# Patient Record
Sex: Female | Born: 1985 | Race: White | Hispanic: No | Marital: Married | Smoking: Former smoker
Health system: Southern US, Community
[De-identification: ages and names within clinical notes are randomized; demographics above are authoritative.]

## PROBLEM LIST (undated history)

## (undated) ENCOUNTER — Inpatient Hospital Stay: Payer: Self-pay

## (undated) DIAGNOSIS — F419 Anxiety disorder, unspecified: Secondary | ICD-10-CM

## (undated) HISTORY — DX: Anxiety disorder, unspecified: F41.9

## (undated) HISTORY — PX: BREAST ENHANCEMENT SURGERY: SHX7

---

## 2005-08-08 ENCOUNTER — Ambulatory Visit: Payer: Self-pay | Admitting: Endocrinology

## 2005-08-12 ENCOUNTER — Encounter: Payer: Self-pay | Admitting: Endocrinology

## 2008-06-03 DIAGNOSIS — R87610 Atypical squamous cells of undetermined significance on cytologic smear of cervix (ASC-US): Secondary | ICD-10-CM

## 2008-06-03 HISTORY — DX: Atypical squamous cells of undetermined significance on cytologic smear of cervix (ASC-US): R87.610

## 2009-06-30 ENCOUNTER — Emergency Department (HOSPITAL_COMMUNITY): Admission: EM | Admit: 2009-06-30 | Discharge: 2009-06-30 | Payer: Self-pay | Admitting: Emergency Medicine

## 2013-06-15 ENCOUNTER — Observation Stay: Payer: Self-pay | Admitting: Obstetrics & Gynecology

## 2013-06-28 ENCOUNTER — Inpatient Hospital Stay: Payer: Self-pay

## 2013-06-28 LAB — CBC WITH DIFFERENTIAL/PLATELET
BASOS ABS: 0.1 10*3/uL (ref 0.0–0.1)
Basophil %: 0.9 %
Eosinophil #: 0.2 10*3/uL (ref 0.0–0.7)
Eosinophil %: 2.4 %
HCT: 35 % (ref 35.0–47.0)
HGB: 11.9 g/dL — AB (ref 12.0–16.0)
LYMPHS ABS: 2.2 10*3/uL (ref 1.0–3.6)
Lymphocyte %: 22.8 %
MCH: 29.9 pg (ref 26.0–34.0)
MCHC: 33.9 g/dL (ref 32.0–36.0)
MCV: 88 fL (ref 80–100)
Monocyte #: 0.7 x10 3/mm (ref 0.2–0.9)
Monocyte %: 7 %
NEUTROS PCT: 66.9 %
Neutrophil #: 6.4 10*3/uL (ref 1.4–6.5)
Platelet: 273 10*3/uL (ref 150–440)
RBC: 3.97 10*6/uL (ref 3.80–5.20)
RDW: 14.3 % (ref 11.5–14.5)
WBC: 9.5 10*3/uL (ref 3.6–11.0)

## 2013-06-28 LAB — GC/CHLAMYDIA PROBE AMP

## 2013-06-29 LAB — HEMATOCRIT: HCT: 26.4 % — ABNORMAL LOW (ref 35.0–47.0)

## 2013-06-30 LAB — CBC WITH DIFFERENTIAL/PLATELET
Basophil #: 0.1 10*3/uL (ref 0.0–0.1)
Basophil %: 0.5 %
Eosinophil #: 0 10*3/uL (ref 0.0–0.7)
Eosinophil %: 0.4 %
HCT: 27.4 % — ABNORMAL LOW (ref 35.0–47.0)
HGB: 9.3 g/dL — AB (ref 12.0–16.0)
LYMPHS PCT: 10.6 %
Lymphocyte #: 1.2 10*3/uL (ref 1.0–3.6)
MCH: 30.1 pg (ref 26.0–34.0)
MCHC: 33.9 g/dL (ref 32.0–36.0)
MCV: 89 fL (ref 80–100)
Monocyte #: 0.7 x10 3/mm (ref 0.2–0.9)
Monocyte %: 6.5 %
NEUTROS ABS: 9 10*3/uL — AB (ref 1.4–6.5)
Neutrophil %: 82 %
Platelet: 240 10*3/uL (ref 150–440)
RBC: 3.09 10*6/uL — AB (ref 3.80–5.20)
RDW: 13.9 % (ref 11.5–14.5)
WBC: 11 10*3/uL (ref 3.6–11.0)

## 2013-06-30 LAB — URINALYSIS, COMPLETE
Bacteria: NONE SEEN
Bilirubin,UR: NEGATIVE
Glucose,UR: NEGATIVE mg/dL (ref 0–75)
Ketone: NEGATIVE
LEUKOCYTE ESTERASE: NEGATIVE
Nitrite: NEGATIVE
Ph: 8 (ref 4.5–8.0)
Protein: NEGATIVE
RBC,UR: 36 /HPF (ref 0–5)
Specific Gravity: 1.017 (ref 1.003–1.030)
WBC UR: 4 /HPF (ref 0–5)

## 2013-06-30 LAB — BASIC METABOLIC PANEL
Anion Gap: 8 (ref 7–16)
BUN: 14 mg/dL (ref 7–18)
CHLORIDE: 103 mmol/L (ref 98–107)
CO2: 25 mmol/L (ref 21–32)
CREATININE: 0.86 mg/dL (ref 0.60–1.30)
Calcium, Total: 9.3 mg/dL (ref 8.5–10.1)
EGFR (Non-African Amer.): >60
Glucose: 99 mg/dL (ref 65–99)
Osmolality: 272 (ref 275–301)
Potassium: 3.5 mmol/L (ref 3.5–5.1)
Sodium: 136 mmol/L (ref 136–145)

## 2013-06-30 LAB — RAPID INFLUENZA A&B ANTIGENS

## 2013-07-01 ENCOUNTER — Observation Stay: Payer: Self-pay | Admitting: Obstetrics and Gynecology

## 2013-07-02 LAB — URINE CULTURE

## 2013-07-05 LAB — CULTURE, BLOOD (SINGLE)

## 2014-10-11 NOTE — H&P (Signed)
L&D Evaluation:  History:  HPI 29 yo G1 with EDC=06/29/2013 presents at 39.6 weeks with c/o ctxs since 4 this AM which she said were every 10 minutes. She has a bloody show on exam, but had not noticed any bleeding or LOF prior to her exam.  P[facility] at [facility] remarkable for anxiety and she currently takes 25 mgm daily of zoloft. LABS: A POS, RI, VI, GBS negative. TDAP given 05/07/13   Presents with contractions   Patient's Medical History Anxiety   Patient's Surgical History Breast augmentation, Wisdom Teeth Extraction   Medications Pre Natal Vitamins  Zoloft 25 mgm daily   Allergies NKDA   Social History none  Former smoker-quit with pregnaancy.   Family History Non-Contributory   ROS:  ROS see HPI   Exam:  Vital Signs stable  133/80   Urine Protein not completed   General no apparent distress   Mental Status clear   Chest clear   Heart normal sinus rhythm, no murmur/gallop/rubs   Abdomen gravid, tender with contractions   Estimated Fetal Weight Average for gestational age   Fetal Position cephalic   Edema no edema   Reflexes 2+   Pelvic no external lesions, 6/C/-1 per RN exam   Mebranes Intact   FHT normal rate with no decels, 120 with accels to 130s to 140s   Ucx q3+ min apart   Skin dry   Impression:  Impression IUP at 39.6 weeks in early/active labor   Plan:  Plan EFM/NST, monitor contractions and for cervical change, Expectant management for now. Can be up ambulating and monitor intermittently. Sips of clear liquids. Consider AROM with next check. Epidural when desires.   Electronic Signatures: Trinna BalloonGutierrez, Lezley Bedgood L (CNM)  (Signed 26-Jan-15 07:38)  Authored: L&D Evaluation   Last Updated: 26-Jan-15 07:38 by Trinna BalloonGutierrez, Richar Dunklee L (CNM)

## 2014-10-11 NOTE — H&P (Signed)
L&D Evaluation:  History Expanded:  HPI 29 yo G1 at 2338 weeks w LOF this am, continuous.  No cts or pain or vaginal bleeding.  [facility] at [facility].   Gravida 1   Term 0   Presents with leaking fluid   Patient's Medical History No Chronic Illness   Patient's Surgical History Breast augmentation, Wisdom Teeth Extraction   Medications Pre Natal Vitamins   Allergies NKDA   Social History none   Family History Non-Contributory   ROS:  ROS All systems were reviewed.  HEENT, CNS, GI, GU, Respiratory, CV, Renal and Musculoskeletal systems were found to be normal.   Exam:  Vital Signs stable   General no apparent distress   Mental Status clear   Abdomen gravid, non-tender   Estimated Fetal Weight Average for gestational age   Back no CVAT   Edema no edema   Pelvic no external lesions, 1-2/TH/hi   Mebranes Intact, Neg Fern and NITRAZINE and POOLING   FHT normal rate with no decels   Ucx absent   Skin dry   Impression:  Impression Urinary Incontinence.  No premature rupture of membranes.   Plan:  Plan EFM/NST, monitor contractions and for cervical change   Electronic Signatures: Letitia LibraHarris, Trendon Zaring Paul (MD)  (Signed 13-Jan-15 13:37)  Authored: L&D Evaluation   Last Updated: 13-Jan-15 13:37 by Letitia LibraHarris, Tanasia Budzinski Paul (MD)

## 2015-06-04 NOTE — L&D Delivery Note (Addendum)
Delivery Note At 5:22 PM a viable female was delivered via Vaginal, Spontaneous Delivery (Presentation: OA;  ROA with compound right hand).  APGAR: 8, 9; Weight: 2660g.   Placenta status: spontaneous, intact.  Cord:  with the following complications: none.  Cord pH: NA  Called to see patient.  Mom pushed to deliver a viable female infant.  The head and compound right hand followed by shoulders, which delivered without difficulty, and the rest of the body.  No nuchal cord noted.  Baby to mom's chest.  Cord clamped and cut after 3 min delay.  Cord blood obtained.  Placenta delivered spontaneously, intact, with a 3-vessel cord.  First degree perineal laceration repaired with 3-0 Vicryl in standard fashion.  All counts correct.  Hemostasis obtained with IV pitocin and fundal massage.     Anesthesia:  epidural Episiotomy:  none Lacerations:  Small 1st degree Suture Repair: 3.0 vicryl Est. Blood Loss (mL): 250  Mom to postpartum.  Baby to Couplet care / Skin to Skin.  Eliseo Withers, CNM 03/22/2016, 6:07 PM

## 2015-06-09 ENCOUNTER — Emergency Department
Admission: EM | Admit: 2015-06-09 | Discharge: 2015-06-09 | Disposition: A | Discharge status: Home / Self Care | Payer: 59 | Attending: Emergency Medicine | Admitting: Emergency Medicine

## 2015-06-09 ENCOUNTER — Encounter: Payer: Self-pay | Admitting: Emergency Medicine

## 2015-06-09 ENCOUNTER — Ambulatory Visit
Admission: RE | Admit: 2015-06-09 | Discharge: 2015-06-09 | Disposition: A | Discharge status: Home / Self Care | Payer: 59 | Source: Ambulatory Visit | Attending: Physician Assistant | Admitting: Physician Assistant

## 2015-06-09 ENCOUNTER — Other Ambulatory Visit: Payer: Self-pay | Admitting: Physician Assistant

## 2015-06-09 DIAGNOSIS — R1030 Lower abdominal pain, unspecified: Secondary | ICD-10-CM | POA: Diagnosis present

## 2015-06-09 DIAGNOSIS — R1031 Right lower quadrant pain: Secondary | ICD-10-CM

## 2015-06-09 DIAGNOSIS — Z3202 Encounter for pregnancy test, result negative: Secondary | ICD-10-CM | POA: Diagnosis not present

## 2015-06-09 LAB — CBC
HEMATOCRIT: 39.4 % (ref 35.0–47.0)
HEMOGLOBIN: 13.4 g/dL (ref 12.0–16.0)
MCH: 30.6 pg (ref 26.0–34.0)
MCHC: 34.1 g/dL (ref 32.0–36.0)
MCV: 89.7 fL (ref 80.0–100.0)
Platelets: 254 10*3/uL (ref 150–440)
RBC: 4.39 MIL/uL (ref 3.80–5.20)
RDW: 13.4 % (ref 11.5–14.5)
WBC: 10.7 10*3/uL (ref 3.6–11.0)

## 2015-06-09 LAB — URINALYSIS COMPLETE WITH MICROSCOPIC (ARMC ONLY)
Bacteria, UA: NONE SEEN
Bilirubin Urine: NEGATIVE
GLUCOSE, UA: NEGATIVE mg/dL
HGB URINE DIPSTICK: NEGATIVE
NITRITE: NEGATIVE
Protein, ur: NEGATIVE mg/dL
SPECIFIC GRAVITY, URINE: 1.03 (ref 1.005–1.030)
pH: 5 (ref 5.0–8.0)

## 2015-06-09 LAB — COMPREHENSIVE METABOLIC PANEL
ALBUMIN: 4.3 g/dL (ref 3.5–5.0)
ALK PHOS: 93 U/L (ref 38–126)
ALT: 56 U/L — ABNORMAL HIGH (ref 14–54)
ANION GAP: 8 (ref 5–15)
AST: 41 U/L (ref 15–41)
BILIRUBIN TOTAL: 0.6 mg/dL (ref 0.3–1.2)
BUN: 14 mg/dL (ref 6–20)
CALCIUM: 9.3 mg/dL (ref 8.9–10.3)
CO2: 24 mmol/L (ref 22–32)
Chloride: 109 mmol/L (ref 101–111)
Creatinine, Ser: 0.75 mg/dL (ref 0.44–1.00)
GFR calc Af Amer: >60 mL/min (ref >60)
GLUCOSE: 118 mg/dL — AB (ref 65–99)
POTASSIUM: 3.3 mmol/L — AB (ref 3.5–5.1)
Sodium: 141 mmol/L (ref 135–145)
TOTAL PROTEIN: 7.7 g/dL (ref 6.5–8.1)

## 2015-06-09 LAB — POCT PREGNANCY, URINE: Preg Test, Ur: NEGATIVE

## 2015-06-09 LAB — LIPASE, BLOOD: Lipase: 42 U/L (ref 11–51)

## 2015-06-09 MED ORDER — IOHEXOL 300 MG/ML  SOLN
100.0000 mL | Freq: Once | INTRAMUSCULAR | Status: AC | PRN
  Administered 2015-06-09: 100 mL via INTRAVENOUS

## 2015-06-09 NOTE — ED Notes (Signed)
Pt states LMP 05/23/15

## 2015-06-09 NOTE — ED Notes (Signed)
Patient ambulatory to triage with steady gait, without difficulty or distress noted; pt reports lower abd cramping x hour with no accomp symptoms

## 2015-06-09 NOTE — Discharge Instructions (Signed)
Return to the ER for recurrent pain, persistent vomiting, difficulty breathing or other concerns.  Abdominal Pain, Adult Many things can cause abdominal pain. Usually, abdominal pain is not caused by a disease and will improve without treatment. It can often be observed and treated at home. Your health care provider will do a physical exam and possibly order blood tests and X-rays to help determine the seriousness of your pain. However, in many cases, more time must pass before a clear cause of the pain can be found. Before that point, your health care provider may not know if you need more testing or further treatment. HOME CARE INSTRUCTIONS Monitor your abdominal pain for any changes. The following actions may help to alleviate any discomfort you are experiencing:  Only take over-the-counter or prescription medicines as directed by your health care provider.  Do not take laxatives unless directed to do so by your health care provider.  Try a clear liquid diet (broth, tea, or water) as directed by your health care provider. Slowly move to a bland diet as tolerated. SEEK MEDICAL CARE IF:  You have unexplained abdominal pain.  You have abdominal pain associated with nausea or diarrhea.  You have pain when you urinate or have a bowel movement.  You experience abdominal pain that wakes you in the night.  You have abdominal pain that is worsened or improved by eating food.  You have abdominal pain that is worsened with eating fatty foods.  You have a fever. SEEK IMMEDIATE MEDICAL CARE IF:  Your pain does not go away within 2 hours.  You keep throwing up (vomiting).  Your pain is felt only in portions of the abdomen, such as the right side or the left lower portion of the abdomen.  You pass bloody or black tarry stools. MAKE SURE YOU:  Understand these instructions.  Will watch your condition.  Will get help right away if you are not doing well or get worse.   This information  is not intended to replace advice given to you by your health care provider. Make sure you discuss any questions you have with your health care provider.   Document Released: 02/27/2005 Document Revised: 02/08/2015 Document Reviewed: 01/27/2013 Elsevier Interactive Patient Education [employer]2016 Elsevier Inc.

## 2015-06-09 NOTE — ED Notes (Signed)
Pt discharged to home.  Discharge instructions reviewed.  Verbalized understanding.  No questions or concerns at this time.  Teach back verified.  Pt in NAD.  No items left in ED.   

## 2015-06-09 NOTE — ED Provider Notes (Signed)
[facility] Emergency Department Provider Note  ____________________________________________  Time seen: Approximately 2:25 AM  I have reviewed the triage vital signs and the nursing notes.   HISTORY  Chief Complaint Abdominal Cramping    HPI Shannon Giles is a 30 y.o. female who presents to the ED from home with a chief complaint of low abdominal pain. Patient describes sudden onset of lower abdominal pain approximately one hour prior to arrival. Describes sharp, centralized pain not localized to either quadrant. Symptoms were not associated with nausea or vomiting.Denies recent fever, chills, chest pain, shortness of breath, dysuria, hematuria, vaginal bleeding, vaginal discharge. Denies recent travel or trauma. Denies prior history of ovarian cysts or endometriosis.   Past medical history None  There are no active problems to display for this patient.   Past Surgical History  Procedure Laterality Date  . Breast enhancement surgery      No current outpatient prescriptions on file.  Allergies Review of patient's allergies indicates no known allergies.  No family history on file.  Social History Social History  Substance Use Topics  . Smoking status: Never Smoker   . Smokeless tobacco: None  . Alcohol Use: No    Review of Systems Constitutional: No fever/chills Eyes: No visual changes. ENT: No sore throat. Cardiovascular: Denies chest pain. Respiratory: Denies shortness of breath. Gastrointestinal: Positive for abdominal pain.  No nausea, no vomiting.  No diarrhea.  No constipation. Genitourinary: Negative for dysuria. Musculoskeletal: Negative for back pain. Skin: Negative for rash. Neurological: Negative for headaches, focal weakness or numbness.  10-point ROS otherwise negative.  ____________________________________________   PHYSICAL EXAM:  VITAL SIGNS: ED Triage Vitals  Enc Vitals Group     BP --      Pulse --       Resp --      Temp --      Temp src --      SpO2 --      Weight --      Height --      Head Cir --      Peak Flow --      Pain Score 06/09/15 0018 7     Pain Loc --      Pain Edu? --      Excl. in GC? --     Constitutional: Alert and oriented. Well appearing and in no acute distress. Eyes: Conjunctivae are normal. PERRL. EOMI. Head: Atraumatic. Nose: No congestion/rhinnorhea. Mouth/Throat: Mucous membranes are moist.  Oropharynx non-erythematous. Neck: No stridor.   Cardiovascular: Normal rate, regular rhythm. Grossly normal heart sounds.  Good peripheral circulation. Respiratory: Normal respiratory effort.  No retractions. Lungs CTAB. Gastrointestinal: Soft and nontender to light or deep palpation. No distention. No abdominal bruits. No CVA tenderness. Musculoskeletal: No lower extremity tenderness nor edema.  No joint effusions. Neurologic:  Normal speech and language. No gross focal neurologic deficits are appreciated. No gait instability. Skin:  Skin is warm, dry and intact. No rash noted. Psychiatric: Mood and affect are normal. Speech and behavior are normal.  ____________________________________________   LABS (all labs ordered are listed, but only abnormal results are displayed)  Labs Reviewed  COMPREHENSIVE METABOLIC PANEL - Abnormal; Notable for the following:    Potassium 3.3 (*)    Glucose, Bld 118 (*)    ALT 56 (*)    All other components within normal limits  URINALYSIS COMPLETEWITH MICROSCOPIC ([facility] ONLY) - Abnormal; Notable for the following:    Color, Urine YELLOW (*)  APPearance CLEAR (*)    Ketones, ur TRACE (*)    Leukocytes, UA TRACE (*)    Squamous Epithelial / LPF 0-5 (*)    All other components within normal limits  LIPASE, BLOOD  CBC  POC URINE PREG, ED  POCT PREGNANCY, URINE    ____________________________________________  EKG  None ____________________________________________  RADIOLOGY  None ____________________________________________   PROCEDURES  Procedure(s) performed: None  Critical Care performed: No  ____________________________________________   INITIAL IMPRESSION / ASSESSMENT AND PLAN / ED COURSE  Pertinent labs & imaging results that were available during my care of the patient were reviewed by me and considered in my medical decision making (see chart for details).  30 year old female who presents with sudden onset lower abdominal pain approximately 4-5 hours ago. Patient took Tylenol prior to arrival. Currently states she is not having any pain and does not wish to proceed with my plan for pelvic exam and pelvic ultrasound. She feels completely back to normal. Laboratory and urinalysis results unremarkable. Patient desires to be discharged home, knowing that she is welcome back at any time should her pain recur. Strict return precautions given. Patient verbalizes understanding and agrees with plan of care. ____________________________________________   FINAL CLINICAL IMPRESSION(S) / ED DIAGNOSES  Final diagnoses:  Lower abdominal pain      Irean Hong, MD 06/09/15 [phone]

## 2016-01-22 ENCOUNTER — Encounter: Payer: Self-pay | Admitting: Certified Nurse Midwife

## 2016-01-22 ENCOUNTER — Observation Stay
Admission: EM | Admit: 2016-01-22 | Discharge: 2016-01-22 | Disposition: A | Discharge status: Home / Self Care | Payer: BLUE CROSS/BLUE SHIELD | Attending: Certified Nurse Midwife | Admitting: Certified Nurse Midwife

## 2016-01-22 DIAGNOSIS — Z3A30 30 weeks gestation of pregnancy: Secondary | ICD-10-CM | POA: Diagnosis not present

## 2016-01-22 DIAGNOSIS — O368390 Maternal care for abnormalities of the fetal heart rate or rhythm, unspecified trimester, not applicable or unspecified: Secondary | ICD-10-CM | POA: Diagnosis present

## 2016-01-22 HISTORY — DX: Maternal care for abnormalities of the fetal heart rate or rhythm, unspecified trimester, not applicable or unspecified: O36.8390

## 2016-01-22 NOTE — OB Triage Note (Signed)
Pt sent from doctor's appt. (WS) to be monitored for fetal arrhythmia.  NST to be performed.

## 2016-01-22 NOTE — Final Progress Note (Addendum)
Physician Final Progress Note  Patient ID: Shannon Giles MRN: [id number] DOB/AGE: 30-Aug-1985 30 y.o.  Admit date: 01/22/2016 Admitting provider: Nadara MustardoJerelyn Scottbert P Harris, MD Discharge date: 01/22/2016   Admission Diagnoses: IUP at 30.3 weeks Fetal arrhythmia  Discharge Diagnoses:  Active Problems:   Fetal arrhythmia affecting pregnancy, antepartum  IUP at 30.3 weeks  Consults: [facility] at [facility]  Significant Findings/ Diagnostic Studies: 30 year old G2 P1001 with EDC=10/27 2017 presented from the office for a NST after a fetal heart rate irregularity noted during ROB visit. Here in L&D there was a irregular irregularity in an otherwise reactive non stress test. Baseline FHR was 145 with accelerations to 170s and moderate variability. It sounded like extra beats in no particular pattern. [facility] was WNL.  Baby active. Consulted with Dr Rudy JewA James from [facility] who recommended referring patient for ultrasound on fetal heart to rule out structural anomalies and if the arrhythmia continues or there is an anomaly would also recommend a fetal echo. In the mean time to continue weekly FHT check  to see if the irregular heart rate persist. Reassured patient  that this kind of irregularity is common and is usually due to the immature fetal heart.   Procedures: Non stress test  Discharge Condition: stable  Disposition: 01-Home or Self Care  Diet: Regular diet  Discharge Activity: Activity as tolerated     Medication List    TAKE these medications   multivitamin-prenatal 27-0.8 MG Tabs tablet Take 1 tablet by mouth daily at 12 noon.      [facility] August [facility] Will be called regarding [facility] appts  Total time spent taking care of this patient: 20 minutes  Signed: [name] 01/22/2016, 2:29 PM

## 2016-01-22 NOTE — Discharge Instructions (Signed)
Pt. Is to follow [facility] at the [facility] doctor's office on Monday, August 28th at 0930.  [facility] will call you to schedule an appt. With [facility].  Any additional questions, please call the office.  Labor precautions discussed with patient; pt. Verbalized understanding.  Copy of discharge signed and given to patient.

## 2016-03-08 ENCOUNTER — Observation Stay
Admission: EM | Admit: 2016-03-08 | Discharge: 2016-03-08 | Disposition: A | Discharge status: Home / Self Care | Payer: BLUE CROSS/BLUE SHIELD | Attending: Obstetrics and Gynecology | Admitting: Obstetrics and Gynecology

## 2016-03-08 DIAGNOSIS — O479 False labor, unspecified: Principal | ICD-10-CM | POA: Insufficient documentation

## 2016-03-08 NOTE — Discharge Instructions (Signed)
Get plenty of rest and drink plenty of fluids.  Keep next appointment with provider.  Contact your provider with questions/concerns.

## 2016-03-08 NOTE — Final Progress Note (Signed)
Physician Final Progress Note  Patient ID: Shannon ScottChelsea R Giles MRN: [id number] DOB/AGE: 09/08/1985 30 y.o.  Admit date: 03/08/2016 Admitting provider: Vena AustriaAndreas Ranesha Val, MD Discharge date: 03/08/2016   Admission Diagnoses: Irregular contractions  Discharge Diagnoses:  Active Problems:   Labor and delivery indication for care or intervention Braxton Hicks contractions  Consults: none  Significant Findings/ Diagnostic Studies: none  Procedures: NST 145, moderate, +accels, no decels Toco q12min on admission spaced out to q954min, no particularly painful no cervical change.  Negative spontaneous contraction stress test.  Cervix stable at 3/50/-3  Discharge Condition: good  Disposition: 01-Home or Self Care  Diet: Regular diet  Discharge Activity: Activity as tolerated  Discharge Instructions    Discharge activity:  No Restrictions    Complete by:  As directed    Fetal Kick Count:  Lie on our left side for one hour after a meal, and count the number of times your baby kicks.  If it is less than 5 times, get up, move around and drink some juice.  Repeat the test 30 minutes later.  If it is still less than 5 kicks in an hour, notify your doctor.    Complete by:  As directed    LABOR:  When conractions begin, you should start to time them from the beginning of one contraction to the beginning  of the next.  When contractions are 5 - 10 minutes apart or less and have been regular for at least an hour, you should call your health care provider.    Complete by:  As directed    No sexual activity restrictions    Complete by:  As directed    Notify physician for bleeding from the vagina    Complete by:  As directed    Notify physician for blurring of vision or spots before the eyes    Complete by:  As directed    Notify physician for chills or fever    Complete by:  As directed    Notify physician for fainting spells, "black outs" or loss of consciousness    Complete by:  As directed    Notify physician for increase in vaginal discharge    Complete by:  As directed    Notify physician for leaking of fluid    Complete by:  As directed    Notify physician for pain or burning when urinating    Complete by:  As directed    Notify physician for pelvic pressure (sudden increase)    Complete by:  As directed    Notify physician for severe or continued nausea or vomiting    Complete by:  As directed    Notify physician for sudden gushing of fluid from the vagina (with or without continued leaking)    Complete by:  As directed    Notify physician for sudden, constant, or occasional abdominal pain    Complete by:  As directed    Notify physician if baby moving less than usual    Complete by:  As directed        Medication List    TAKE these medications   guaiFENesin 600 MG 12 hr tablet Commonly known as:  MUCINEX Take 600 mg by mouth 2 (two) times daily.   multivitamin-prenatal 27-0.8 MG Tabs tablet Take 1 tablet by mouth daily at 12 noon.        Total time spent taking care of this patient: 15 minutes   Signed: Lorrene ReidSTAEBLER, Darly Fails M 03/08/2016, 10:26 PM

## 2016-03-08 NOTE — OB Triage Note (Signed)
G1P1 starting having contractions x 2 hours starting around 1800.  Checked earlier in the office today and she was 3 cm.  No leaking fluid/blood.  Will continue to monitor.

## 2016-03-13 ENCOUNTER — Encounter: Payer: Self-pay | Admitting: *Deleted

## 2016-03-13 ENCOUNTER — Observation Stay
Admission: EM | Admit: 2016-03-13 | Discharge: 2016-03-13 | Disposition: A | Discharge status: Home / Self Care | Payer: BLUE CROSS/BLUE SHIELD | Attending: Obstetrics & Gynecology | Admitting: Obstetrics & Gynecology

## 2016-03-13 DIAGNOSIS — N898 Other specified noninflammatory disorders of vagina: Secondary | ICD-10-CM | POA: Diagnosis present

## 2016-03-13 DIAGNOSIS — O26893 Other specified pregnancy related conditions, third trimester: Principal | ICD-10-CM | POA: Insufficient documentation

## 2016-03-13 DIAGNOSIS — O26899 Other specified pregnancy related conditions, unspecified trimester: Secondary | ICD-10-CM | POA: Diagnosis present

## 2016-03-13 DIAGNOSIS — Z3A37 37 weeks gestation of pregnancy: Secondary | ICD-10-CM | POA: Insufficient documentation

## 2016-03-13 HISTORY — DX: Other specified noninflammatory disorders of vagina: N89.8

## 2016-03-13 MED ORDER — ONDANSETRON HCL 4 MG/2ML IJ SOLN: 4.0000 mg | Freq: Four times a day (QID) | INTRAMUSCULAR | Status: DC | PRN

## 2016-03-13 MED ORDER — ACETAMINOPHEN 325 MG PO TABS: 650.0000 mg | ORAL_TABLET | ORAL | Status: DC | PRN

## 2016-03-13 NOTE — H&P (Signed)
Obstetrics Admission History & Physical   leaking fluid   HPI:  30 y.o. G2P1001 @ 2156w5d (03/29/2016, by Last Menstrual Period). Admitted on 03/13/2016:   Patient Active Problem List   Diagnosis Date Noted  . Labor and delivery indication for care or intervention 03/08/2016  . Fetal arrhythmia affecting pregnancy, antepartum 01/22/2016     Presents for 1430 occurrence of vaginal discharge/ fluid.  No ctxs or abd pain, No VB.  Good FM.  [facility]  PMHx: History reviewed. No pertinent past medical history. PSHx:  Past Surgical History:  Procedure Laterality Date  . BREAST ENHANCEMENT SURGERY     Medications:  Prescriptions Prior to Admission  Medication Sig Dispense Refill Last Dose  . guaiFENesin (MUCINEX) 600 MG 12 hr tablet Take 600 mg by mouth 2 (two) times daily.   03/07/2016 at Unknown time  . Prenatal Vit-Fe Fumarate-FA (MULTIVITAMIN-PRENATAL) 27-0.8 MG TABS tablet Take 1 tablet by mouth daily at 12 noon.   03/07/2016 at Unknown time   Allergies: has No Known Allergies. OBHx:  OB History  Gravida Para Term Preterm AB Living  2 1 1     1   SAB TAB Ectopic Multiple Live Births          1    # Outcome Date GA Lbr Len/2nd Weight Sex Delivery Anes PTL Lv  2 Current           1 Term 06/28/13 9551w6d  6 lb 2.4 oz (2.79 kg) M Vag-Vacuum  N LIV     ZOX:[id number]:Negative/unremarkable except as detailed in HPI. Soc Hx: Never smoker, Alcohol: none, Recreational drug use: none and Denies domestic abuse  Objective:   Vitals:   03/13/16 1500 03/13/16 1501  BP: 118/70 118/70  Pulse: 94 94  Temp: 98.1 F (36.7 C)    General: Well nourished, well developed female in no acute distress.  Skin: Warm and dry.  Cardiovascular:Regular rate and rhythm. Respiratory: Clear to auscultation bilateral. Normal respiratory effort Abdomen: no pain, gravid, ND Neuro/Psych: Normal mood and affect.   Pelvic exam: is not limited by body habitus EGBUS: within normal limits Vagina:  within normal limits and with normal mucosa blood in the vault Cervix: 3/70/-3, BBOW, Nitrazine and pool Neg Uterus: No contractions observed for 60 minutes.  Adnexa: not evaluated  EFM:FHR: 140 bpm, variability: moderate,  accelerations:  Present,  decelerations:  Absent Toco: None   Perinatal info:  Blood type: O positive Rubella- Immune Varicella -Immune TDaP Given during third trimester of this pregnancy RPR NR / HIV Neg/ HBsAg Neg   Assessment & Plan:   30 y.o. G2P1001 @ 956w5d, Admitted on 03/13/2016: Vaginal discharge; no signs of SROM.  No sign of labor. Labor precautions discussed. Monitor for any persistant LOF. NST Friday.

## 2016-03-13 NOTE — OB Triage Note (Signed)
G2P1 Presents at 3646w5d c/o leaking fluid. Felt a small gush of fluid when standing at 14:30 today. Noted clear fluid on a panty liner. Has not noticed any contractions. Baby is moving. No vaginal bleeding.

## 2016-03-13 NOTE — Discharge Summary (Signed)
Pt d/c'd to home with significant other in stable condition. Given instructions on labor precautions. Verbalized understanding. Will follow up with scheduled appointment.

## 2016-03-13 NOTE — Discharge Instructions (Signed)
Drink plenty of fluid, get plenty of rest. Follow up with your scheduled appointment.

## 2016-03-13 NOTE — Discharge Summary (Signed)
Physician Discharge Summary  Patient ID: Shannon ScottChelsea R Fichter MRN: [id number] DOB/AGE: 30-Jan-1986 30 y.o.  Admit date: 03/13/2016 Discharge date: 03/13/2016  [facility] 30 [facility] complaints of vag discharge/ leakage.  See H&P.  Discharge Diagnoses:  Active Problems:   Vaginal discharge during pregnancy  Discharged Condition: good  Hospital Course: Pt seen and examined with no signs/sx's of SROM or Labor.  Significant Diagnostic Studies: A NST procedure was performed with FHR monitoring and a normal baseline established, appropriate time of 20-40 minutes of evaluation, and accels >2 seen w 15x15 characteristics.  Results show a REACTIVE NST.   Discharge Exam: Blood pressure 118/70, pulse 94, temperature 98.1 F (36.7 C), last menstrual period 06/23/2015. see H&P  Disposition: 01-Home or Self Care     Medication List    STOP taking these medications   guaiFENesin 600 MG 12 hr tablet Commonly known as:  MUCINEX     TAKE these medications   multivitamin-prenatal 27-0.8 MG Tabs tablet Take 1 tablet by mouth daily at 12 noon.      Follow-up Information    Letitia Libraobert Paul Harris, MD .   Specialty:  Obstetrics and Gynecology Contact information: [address] [place] [address] [phone]          15 minutes.  Signed: Letitia LibraRobert Paul Harris 03/13/2016, 4:43 PM

## 2016-03-22 ENCOUNTER — Encounter: Payer: Self-pay | Admitting: *Deleted

## 2016-03-22 ENCOUNTER — Inpatient Hospital Stay: Payer: BLUE CROSS/BLUE SHIELD | Admitting: Anesthesiology

## 2016-03-22 ENCOUNTER — Inpatient Hospital Stay
Admission: EM | Admit: 2016-03-22 | Discharge: 2016-03-24 | DRG: 775 | Disposition: A | Discharge status: Home / Self Care | Payer: BLUE CROSS/BLUE SHIELD | Attending: Advanced Practice Midwife | Admitting: Advanced Practice Midwife

## 2016-03-22 DIAGNOSIS — O2243 Hemorrhoids in pregnancy, third trimester: Secondary | ICD-10-CM | POA: Diagnosis present

## 2016-03-22 DIAGNOSIS — D508 Other iron deficiency anemias: Secondary | ICD-10-CM | POA: Diagnosis present

## 2016-03-22 DIAGNOSIS — Z3A39 39 weeks gestation of pregnancy: Secondary | ICD-10-CM | POA: Diagnosis not present

## 2016-03-22 DIAGNOSIS — Z833 Family history of diabetes mellitus: Secondary | ICD-10-CM | POA: Diagnosis not present

## 2016-03-22 DIAGNOSIS — Z87891 Personal history of nicotine dependence: Secondary | ICD-10-CM

## 2016-03-22 DIAGNOSIS — O9902 Anemia complicating childbirth: Secondary | ICD-10-CM | POA: Diagnosis present

## 2016-03-22 DIAGNOSIS — O326XX0 Maternal care for compound presentation, not applicable or unspecified: Secondary | ICD-10-CM | POA: Diagnosis present

## 2016-03-22 LAB — TYPE AND SCREEN
ABO/RH(D): O POS
ANTIBODY SCREEN: NEGATIVE

## 2016-03-22 LAB — CBC
HEMATOCRIT: 29.3 % — AB (ref 35.0–47.0)
Hemoglobin: 10.1 g/dL — ABNORMAL LOW (ref 12.0–16.0)
MCH: 27.6 pg (ref 26.0–34.0)
MCHC: 34.4 g/dL (ref 32.0–36.0)
MCV: 80.3 fL (ref 80.0–100.0)
Platelets: 290 10*3/uL (ref 150–440)
RBC: 3.66 MIL/uL — AB (ref 3.80–5.20)
RDW: 15.1 % — AB (ref 11.5–14.5)
WBC: 9.5 10*3/uL (ref 3.6–11.0)

## 2016-03-22 MED ORDER — PHENYLEPHRINE 40 MCG/ML (10ML) SYRINGE FOR IV PUSH (FOR BLOOD PRESSURE SUPPORT): 80.0000 ug | PREFILLED_SYRINGE | INTRAVENOUS | Status: DC | PRN

## 2016-03-22 MED ORDER — LACTATED RINGERS IV SOLN
INTRAVENOUS | Status: DC
  Administered 2016-03-22: 22:00:00 via INTRAVENOUS

## 2016-03-22 MED ORDER — WITCH HAZEL-GLYCERIN EX PADS: 1.0000 "application " | MEDICATED_PAD | CUTANEOUS | Status: DC | PRN

## 2016-03-22 MED ORDER — LACTATED RINGERS IV SOLN: 500.0000 mL | INTRAVENOUS | Status: DC | PRN

## 2016-03-22 MED ORDER — OXYCODONE HCL 5 MG PO TABS
10.0000 mg | ORAL_TABLET | ORAL | Status: DC | PRN
  Administered 2016-03-23 (×3): 10 mg via ORAL
  Filled 2016-03-22 (×4): qty 2

## 2016-03-22 MED ORDER — FENTANYL 2.5 MCG/ML W/ROPIVACAINE 0.2% IN NS 100 ML EPIDURAL INFUSION (ARMC-ANES)
10.0000 mL/h | EPIDURAL | Status: DC
  Administered 2016-03-22: 10 mL/h via EPIDURAL

## 2016-03-22 MED ORDER — COCONUT OIL OIL: 1.0000 "application " | TOPICAL_OIL | Status: DC | PRN

## 2016-03-22 MED ORDER — EPHEDRINE 5 MG/ML INJ: 10.0000 mg | INTRAVENOUS | Status: DC | PRN

## 2016-03-22 MED ORDER — ONDANSETRON HCL 4 MG/2ML IJ SOLN: 4.0000 mg | INTRAMUSCULAR | Status: DC | PRN

## 2016-03-22 MED ORDER — BENZOCAINE-MENTHOL 20-0.5 % EX AERO: 1.0000 "application " | INHALATION_SPRAY | CUTANEOUS | Status: DC | PRN

## 2016-03-22 MED ORDER — OXYTOCIN BOLUS FROM INFUSION: 500.0000 mL | Freq: Once | INTRAVENOUS | Status: DC

## 2016-03-22 MED ORDER — SENNOSIDES-DOCUSATE SODIUM 8.6-50 MG PO TABS
2.0000 | ORAL_TABLET | ORAL | Status: DC
  Administered 2016-03-23 (×2): 2 via ORAL
  Filled 2016-03-22 (×2): qty 2

## 2016-03-22 MED ORDER — ACETAMINOPHEN 325 MG PO TABS: 650.0000 mg | ORAL_TABLET | ORAL | Status: DC | PRN

## 2016-03-22 MED ORDER — LIDOCAINE HCL (PF) 1 % IJ SOLN
30.0000 mL | INTRAMUSCULAR | Status: DC | PRN
  Filled 2016-03-22: qty 30

## 2016-03-22 MED ORDER — ONDANSETRON HCL 4 MG PO TABS: 4.0000 mg | ORAL_TABLET | ORAL | Status: DC | PRN

## 2016-03-22 MED ORDER — BUTORPHANOL TARTRATE 1 MG/ML IJ SOLN: 1.0000 mg | INTRAMUSCULAR | Status: DC | PRN

## 2016-03-22 MED ORDER — SIMETHICONE 80 MG PO CHEW: 80.0000 mg | CHEWABLE_TABLET | ORAL | Status: DC | PRN

## 2016-03-22 MED ORDER — OXYCODONE HCL 5 MG PO TABS: 5.0000 mg | ORAL_TABLET | ORAL | Status: DC | PRN

## 2016-03-22 MED ORDER — BUPIVACAINE HCL (PF) 0.25 % IJ SOLN
INTRAMUSCULAR | Status: DC | PRN
  Administered 2016-03-22: 5 mL via EPIDURAL
  Administered 2016-03-22 (×2): 4 mL via EPIDURAL

## 2016-03-22 MED ORDER — OXYTOCIN 40 UNITS IN LACTATED RINGERS INFUSION - SIMPLE MED
1.0000 m[IU]/min | INTRAVENOUS | Status: DC
  Administered 2016-03-22: 1 m[IU]/min via INTRAVENOUS
  Filled 2016-03-22: qty 1000

## 2016-03-22 MED ORDER — LACTATED RINGERS IV SOLN: 500.0000 mL | Freq: Once | INTRAVENOUS | Status: DC

## 2016-03-22 MED ORDER — DIPHENHYDRAMINE HCL 50 MG/ML IJ SOLN: 12.5000 mg | INTRAMUSCULAR | Status: DC | PRN

## 2016-03-22 MED ORDER — INFLUENZA VAC SPLIT QUAD 0.5 ML IM SUSY
0.5000 mL | PREFILLED_SYRINGE | INTRAMUSCULAR | Status: DC
  Filled 2016-03-22 (×2): qty 0.5

## 2016-03-22 MED ORDER — LIDOCAINE HCL (PF) 1 % IJ SOLN
INTRAMUSCULAR | Status: DC | PRN
  Administered 2016-03-22: 3 mL

## 2016-03-22 MED ORDER — TERBUTALINE SULFATE 1 MG/ML IJ SOLN: 0.2500 mg | Freq: Once | INTRAMUSCULAR | Status: DC | PRN

## 2016-03-22 MED ORDER — LACTATED RINGERS IV SOLN
INTRAVENOUS | Status: DC
  Administered 2016-03-22: 125 mL/h via INTRAVENOUS

## 2016-03-22 MED ORDER — LIDOCAINE-EPINEPHRINE (PF) 1.5 %-1:200000 IJ SOLN
INTRAMUSCULAR | Status: DC | PRN
  Administered 2016-03-22: 3 mL via PERINEURAL

## 2016-03-22 MED ORDER — MISOPROSTOL 200 MCG PO TABS
ORAL_TABLET | ORAL | Status: AC
  Filled 2016-03-22: qty 4

## 2016-03-22 MED ORDER — DIPHENHYDRAMINE HCL 25 MG PO CAPS: 25.0000 mg | ORAL_CAPSULE | Freq: Four times a day (QID) | ORAL | Status: DC | PRN

## 2016-03-22 MED ORDER — FENTANYL 2.5 MCG/ML W/ROPIVACAINE 0.2% IN NS 100 ML EPIDURAL INFUSION (ARMC-ANES)
EPIDURAL | Status: AC
  Filled 2016-03-22: qty 100

## 2016-03-22 MED ORDER — DIBUCAINE 1 % RE OINT: 1.0000 "application " | TOPICAL_OINTMENT | RECTAL | Status: DC | PRN

## 2016-03-22 MED ORDER — TETANUS-DIPHTH-ACELL PERTUSSIS 5-2.5-18.5 LF-MCG/0.5 IM SUSP: 0.5000 mL | Freq: Once | INTRAMUSCULAR | Status: DC

## 2016-03-22 MED ORDER — IBUPROFEN 600 MG PO TABS
600.0000 mg | ORAL_TABLET | Freq: Four times a day (QID) | ORAL | Status: DC
  Administered 2016-03-22 – 2016-03-23 (×2): 600 mg via ORAL
  Filled 2016-03-22 (×2): qty 1

## 2016-03-22 MED ORDER — AMMONIA AROMATIC IN INHA
RESPIRATORY_TRACT | Status: AC
  Filled 2016-03-22: qty 10

## 2016-03-22 MED ORDER — OXYTOCIN 10 UNIT/ML IJ SOLN
INTRAMUSCULAR | Status: AC
  Filled 2016-03-22: qty 2

## 2016-03-22 MED ORDER — BUPIVACAINE HCL (PF) 0.25 % IJ SOLN: INTRAMUSCULAR | Status: DC | PRN

## 2016-03-22 MED ORDER — OXYTOCIN 40 UNITS IN LACTATED RINGERS INFUSION - SIMPLE MED: 2.5000 [IU]/h | INTRAVENOUS | Status: DC

## 2016-03-22 MED ORDER — PRENATAL MULTIVITAMIN CH
1.0000 | ORAL_TABLET | Freq: Every day | ORAL | Status: DC
  Administered 2016-03-23 – 2016-03-24 (×2): 1 via ORAL
  Filled 2016-03-22 (×2): qty 1

## 2016-03-22 MED ORDER — ONDANSETRON HCL 4 MG/2ML IJ SOLN: 4.0000 mg | Freq: Four times a day (QID) | INTRAMUSCULAR | Status: DC | PRN

## 2016-03-22 NOTE — OB Triage Note (Signed)
Here for IOL.

## 2016-03-22 NOTE — Anesthesia Procedure Notes (Signed)
Epidural Patient location during procedure: OB Start time: 03/22/2016 2:49 PM End time: 03/22/2016 2:59 PM  Staffing Anesthesiologist: Berdine AddisonHOMAS, MATHAI Resident/CRNA: Ginger CarneMICHELET, Jamicah Anstead Performed: resident/CRNA   Preanesthetic Checklist Completed: patient identified, site marked, surgical consent, pre-op evaluation, timeout performed, IV checked, risks and benefits discussed and monitors and equipment checked  Epidural Patient position: sitting Prep: Betadine Patient monitoring: heart rate, continuous pulse ox and blood pressure Approach: midline Location: L4-L5 Injection technique: LOR saline  Needle:  Needle type: Tuohy  Needle gauge: 18 G Needle length: 9 cm and 9 Needle insertion depth: 7 cm Catheter type: closed end flexible Catheter size: 20 Guage Catheter at skin depth: 12 cm Test dose: negative and 1.5% lidocaine with Epi 1:200 K  Assessment Sensory level: T10 Events: blood not aspirated, injection not painful, no injection resistance, negative IV test and no paresthesia  Additional Notes Pt. Evaluated and documentation done after procedure finished. Patient identified. Risks/Benefits/Options discussed with patient including but not limited to bleeding, infection, nerve damage, paralysis, failed block, incomplete pain control, headache, blood pressure changes, nausea, vomiting, reactions to medication both or allergic, itching and postpartum back pain. Confirmed with bedside nurse the patient's most recent platelet count. Confirmed with patient that they are not currently taking any anticoagulation, have any bleeding history or any family history of bleeding disorders. Patient expressed understanding and wished to proceed. All questions were answered. Sterile technique was used throughout the entire procedure. Please see nursing notes for vital signs. Test dose was given through epidural catheter and negative prior to continuing to dose epidural or start infusion. Warning  signs of high block given to the patient including shortness of breath, tingling/numbness in hands, complete motor block, or any concerning symptoms with instructions to call for help. Patient was given instructions on fall risk and not to get out of bed. All questions and concerns addressed with instructions to call with any issues or inadequate analgesia.   Patient tolerated the insertion well without complications.  Reason for block:procedure for pain

## 2016-03-22 NOTE — Progress Notes (Signed)
  Labor Progress Note   30 y.o. G2P1001 @ 4788w0d , admitted for  Pregnancy, Labor Management. Induction of labor for fetal arrhythmia  Subjective:  Pt states she feels about the same as before- she has to breathe through contractions but has not yet needed an epidural. Based on present exam she is requesting the epidural  Objective:  Temp 98.5 F (36.9 C) (Oral)   Resp 18   Ht 5\' 3"  (1.6 m)   Wt 162 lb (73.5 kg)   LMP 06/23/2015   BMI 28.70 kg/m  Abd: mild Extr: trace to 1+ bilateral pedal edema SVE: 6.5/80/-1  EFM: FHR: 135 bpm, variability: moderate,  accelerations:  Present,  decelerations:  Absent Toco: Frequency: Every 2-3 minutes Labs: I have reviewed the patient's lab results.   Assessment & Plan:  G2P1001 @ 2988w0d, admitted for  Pregnancy and Labor/Delivery Management  1. Pain management: requesting epidural. 2. FWB: FHT category I.  3. ID: GBS negative 4. Labor management: Pitocin All discussed with patient, see orders   Calin Ellery, CNM

## 2016-03-22 NOTE — Progress Notes (Signed)
  Labor Progress Note   30 y.o. G2P1001 @ 5859w0d , admitted for  Pregnancy, Labor Management. Induction of labor for fetal arrhythmia  Subjective:  Pt states contractions are beginning to feel stronger  Objective:  Temp 98.5 F (36.9 C) (Oral)   Resp 18   Ht 5\' 3"  (1.6 m)   Wt 162 lb (73.5 kg)   LMP 06/23/2015   BMI 28.70 kg/m  Abd: mild Extr: trace to 1+ bilateral pedal edema SVE: deferred  EFM: FHR: 130 bpm, variability: moderate,  accelerations:  Present,  decelerations:  Absent Toco: Frequency: Every 2-3 minutes Labs: I have reviewed the patient's lab results.   Assessment & Plan:  G2P1001 @ 6159w0d, admitted for  Pregnancy and Labor/Delivery Management  1. Pain management: none. 2. FWB: FHT category I.  3. ID: GBS negative 4. Labor management: Pitocin All discussed with patient, see orders   Ersel Enslin, CNM

## 2016-03-22 NOTE — Discharge Summary (Signed)
OB Discharge Summary     Patient Name: Shannon Giles DOB: [birth year] MRN: [id number]  Date of admission: 03/22/2016 Delivering MD: Hoover Brunette, CNM Date of Delivery: 03/22/2016  Date of discharge: 03/24/16   Admitting diagnosis: 39 weeks Induction of labor for fetal arrhythmia Intrauterine pregnancy: [redacted]w[redacted]d     Secondary diagnosis: None     Discharge diagnosis: Term Pregnancy Delivered                                                                                                Post partum procedures: None  Augmentation: AROM and Pitocin  Complications: None  Hospital course:  Onset of Labor With Vaginal Delivery    30 y.o. yo [id number] at [redacted]w[redacted]d was admitted in Latent Labor on 03/22/2016.  Patient had an uncomplicated labor course as follows:  Membrane Rupture Time/Date: 4:40 PM ,03/22/2016   Delivery of a viable female at 5:22 pm on 03/22/2016 Details of delivery can be found in a separate delivery note   Pateint had an uncomplicated postpartum course.   She is ambulating, tolerating a regular diet, passing flatus, and urinating well.  Patient is discharged home in stable condition on 03/24/16 .   Physical exam  Vitals:   03/23/16 1156 03/23/16 1553 03/23/16 1952 03/24/16 0845  BP: 107/70 123/66 119/67 (!) 96/50  Pulse: 76 69 78 66  Resp: 16 18 17 16   Temp: 98.4 F (36.9 C) 98.4 F (36.9 C) 98.5 F (36.9 C) 98.3 F (36.8 C)  TempSrc: Oral Oral Oral Oral  SpO2: 99%  97% 98%  Weight:      Height:       General: alert, cooperative and no distress Lochia: appropriate Uterine Fundus: firm/ NT/ ML/ U-2 Perineum: Well approximated, no edema or erythema. DVT Evaluation: Negative Homan's, no calf tenderness or edema.  Labs: Lab Results  Component Value Date   WBC 11.4 (H) 03/23/2016   HGB 8.4 (L) 03/23/2016   HCT 25.5 (L) 03/23/2016   MCV 81.3 03/23/2016   PLT 239 03/23/2016   CMP Latest Ref Rng & Units 06/09/2015  Glucose 65 - 99 mg/dL 147(W)  BUN 6 - 20  mg/dL 14  Creatinine 2.95 - 6.21 mg/dL 3.08  Sodium 657 - 846 mmol/L 141  Potassium 3.5 - 5.1 mmol/L 3.3(L)  Chloride 101 - 111 mmol/L 109  CO2 22 - 32 mmol/L 24  Calcium 8.9 - 10.3 mg/dL 9.3  Total Protein 6.5 - 8.1 g/dL 7.7  Total Bilirubin 0.3 - 1.2 mg/dL 0.6  Alkaline Phos 38 - 126 U/L 93  AST 15 - 41 U/L 41  ALT 14 - 54 U/L 56(H)    Discharge instruction: per After Visit Summary.  Medications:  Motrin Stool Softeners Iron Witch hazel pads or Desitin for hemorrhoids  Diet: routine diet  Activity: Advance as tolerated. Pelvic rest for 6 weeks.   Outpatient follow up: Follow-up Information    [facility], [place]. Schedule an appointment as soon as possible for a visit in 6 week(s).   Contact information: [address] [place] [address] [phone]  Postpartum contraception: Undecided, information provided Rhogam Given postpartum: NA Rubella vaccine given postpartum: Rubella Immune Varicella vaccine given postpartum: Varicella Immune TDaP given antepartum or postpartum: given antepartum  Newborn Data: Live born female [name] Birth Weight:  2660g 5#14oz APGAR: 8, 9   Baby Feeding: Bottle  Disposition:home with mother  SIGNED:  Egbert GaribaldiBrothers, Nigel Wessman, CNM 03/24/2016 11:54 AM  Seen with Tonye PearsonLauren Friedman, SNM

## 2016-03-22 NOTE — H&P (Signed)
OB History & Physical   History of Present Illness:  Chief Complaint: IOL for fetal arrhythmia  HPI:  Shannon Giles is a 30 y.o. 632P1001 female at 8666w0d dated by LMP.  Her pregnancy has been complicated by fetal arrhythmia, fetal growth restriction- resolved.    She reports contractions.   She denies leakage of fluid.   She denies vaginal bleeding.   She reports fetal movement.    Maternal Medical History:  History reviewed. No pertinent past medical history.  Past Surgical History:  Procedure Laterality Date  . BREAST ENHANCEMENT SURGERY      No Known Allergies  Prior to Admission medications   Medication Sig Start Date End Date Taking? Authorizing Provider  Prenatal Vit-Fe Fumarate-FA (MULTIVITAMIN-PRENATAL) 27-0.8 MG TABS tablet Take 1 tablet by mouth daily at 12 noon.   Yes Historical Provider, MD    OB History  Gravida Para Term Preterm AB Living  2 1 1     1   SAB TAB Ectopic Multiple Live Births          1    # Outcome Date GA Lbr Len/2nd Weight Sex Delivery Anes PTL Lv  2 Current           1 Term 06/28/13 359w6d  6 lb 2.4 oz (2.79 kg) M Vag-Vacuum  N LIV      Prenatal care site: [facility]  Social History: She  reports that she quit smoking about 5 years ago. She has never used smokeless tobacco. She reports that she does not drink alcohol or use drugs.  Family History: family history includes Diabetes in her maternal grandmother and paternal grandmother.   Review of Systems: Negative x 10 systems reviewed except as noted in the HPI.    Physical Exam:  Vital Signs: Temp 98.5 F (36.9 C) (Oral)   Resp 20   Ht 5\' 3"  (1.6 m)   Wt 162 lb (73.5 kg)   LMP 06/23/2015   BMI 28.70 kg/m  General: no acute distress.  HEENT: normocephalic, atraumatic Heart: regular rate & rhythm.  No murmurs/rubs/gallops Lungs: clear to auscultation bilaterally Abdomen: soft, gravid, non-tender;  EFW: 6#5oz Pelvic:   External: Normal external female genitalia  Cervix:  Dilation: 4 / Effacement (%): 50 / Station: -2   Extremities: non-tender, symmetric, no edema bilaterally.  DTRs: 2+  Neurologic: Alert & oriented x 3.    Pertinent Results:  Prenatal Labs: Blood type/Rh O positive  Antibody screen negative  Rubella Immune  Varicella Immune    RPR Non reactive  HBsAg negative  HIV negative  GC negative  Chlamydia negative  Genetic screening First trimester screen negative  1 hour GTT 111  3 hour GTT NA  GBS negative on 9/26   Baseline FHR: 135 beats/min   Variability: moderate   Accelerations: present   Decelerations: absent Contractions: present frequency: 3-4 Overall assessment: Category I    Assessment:  Shannon ScottChelsea R Langelier is a 30 y.o. 782P1001 female at 7566w0d admitted for IOL for fetal arrhythmia.   Plan:  1. Admit to Labor & Delivery  2. CBC, T&S, Clrs, IVF 3. GBS negative.   4. Fetal well-being: negative 5. Induction with pitocin   Negan Grudzien, CNM 03/22/2016 8:48 AM

## 2016-03-22 NOTE — Progress Notes (Signed)
  Labor Progress Note   30 y.o. G2P1001 @ 6320w0d , admitted for  Pregnancy, Labor Management. Induction of labor for fetal arrhythmia  Subjective:  Pt states she is comfortable with epidural but more numb on her left, however after exam/AROM and sitting her up she is feeling the cramping more intensely. Pt is now tilted right and in semi fowlers.  Objective:  BP (!) 105/46   Pulse 78   Temp 98.5 F (36.9 C) (Oral)   Resp 18   Ht 5\' 3"  (1.6 m)   Wt 162 lb (73.5 kg)   LMP 06/23/2015   BMI 28.70 kg/m  Abd: mild Extr: trace to 1+ bilateral pedal edema SVE: 6.5/80/-1 no change from previous exam AROM clear with light pink  EFM: FHR: 130 bpm, variability: moderate,  accelerations:  Present,  decelerations:  Absent Toco: Frequency: Every 2-3 minutes Labs: I have reviewed the patient's lab results.   Assessment & Plan:  G2P1001 @ 4220w0d, admitted for  Pregnancy and Labor/Delivery Management  1. Pain management: requesting epidural. 2. FWB: FHT category I.  3. ID: GBS negative 4. Labor management: Pitocin, AROM All discussed with patient, see orders   Shaina Gullatt, CNM

## 2016-03-22 NOTE — Anesthesia Preprocedure Evaluation (Signed)
Anesthesia Evaluation  Patient identified by MRN, date of birth, ID band Patient awake    Reviewed: Allergy & Precautions, NPO status , Patient's Chart, lab work & pertinent test results, reviewed documented beta blocker date and time   Airway Mallampati: II  TM Distance: >3 FB     Dental  (+) Chipped   Pulmonary former smoker,           Cardiovascular      Neuro/Psych    GI/Hepatic   Endo/Other    Renal/GU      Musculoskeletal   Abdominal   Peds  Hematology   Anesthesia Other Findings   Reproductive/Obstetrics                             Anesthesia Physical Anesthesia Plan  ASA: II  Anesthesia Plan: Epidural   Post-op Pain Management:    Induction:   Airway Management Planned:   Additional Equipment:   Intra-op Plan:   Post-operative Plan:   Informed Consent: I have reviewed the patients History and Physical, chart, labs and discussed the procedure including the risks, benefits and alternatives for the proposed anesthesia with the patient or authorized representative who has indicated his/her understanding and acceptance.     Plan Discussed with: CRNA  Anesthesia Plan Comments:         Anesthesia Quick Evaluation  

## 2016-03-23 LAB — CBC
HCT: 25.5 % — ABNORMAL LOW (ref 35.0–47.0)
Hemoglobin: 8.4 g/dL — ABNORMAL LOW (ref 12.0–16.0)
MCH: 26.7 pg (ref 26.0–34.0)
MCHC: 32.8 g/dL (ref 32.0–36.0)
MCV: 81.3 fL (ref 80.0–100.0)
Platelets: 239 10*3/uL (ref 150–440)
RBC: 3.14 MIL/uL — ABNORMAL LOW (ref 3.80–5.20)
RDW: 15.3 % — ABNORMAL HIGH (ref 11.5–14.5)
WBC: 11.4 10*3/uL — AB (ref 3.6–11.0)

## 2016-03-23 LAB — RPR: RPR Ser Ql: NONREACTIVE

## 2016-03-23 MED ORDER — IBUPROFEN 600 MG PO TABS
600.0000 mg | ORAL_TABLET | Freq: Four times a day (QID) | ORAL | Status: DC
  Administered 2016-03-23 – 2016-03-24 (×4): 600 mg via ORAL
  Filled 2016-03-23 (×5): qty 1

## 2016-03-23 MED ORDER — FERROUS SULFATE 325 (65 FE) MG PO TABS
325.0000 mg | ORAL_TABLET | Freq: Two times a day (BID) | ORAL | Status: DC
  Administered 2016-03-23 – 2016-03-24 (×2): 325 mg via ORAL
  Filled 2016-03-23 (×2): qty 1

## 2016-03-23 NOTE — Progress Notes (Signed)
  Postpartum Day 1  Subjective: no complaints, up ad lib, voiding and tolerating PO  Objective: Blood pressure 107/70, pulse 76, temperature 98.4 F (36.9 C), temperature source Oral, resp. rate 16, height 5\' 3"  (1.6 m), weight 73.5 kg (162 lb), last menstrual period 06/23/2015, SpO2 99 %, unknown if currently breastfeeding.  Physical Exam:  General: alert, cooperative and no distress Lochia: appropriate Uterine Fundus: firm/ NT/ML -1  Recent Labs  03/22/16 0912 03/23/16 0639  HGB 10.1* 8.4*  HCT 29.3* 25.5*    Assessment PPD 1.  Acute blood loss anemia with chronic iron deficiency.  Plan: Continue PP care, Continue PO care, Advance activity as tolerated and Fe replacement, anemia precautions  Feeding: Bottle Contraception: BTL, unsure of timing Blood Type: O+ RI/VI TDAP UTD  Marta AntuBrothers, Troyce Febo, CNM Seen with Yolande JollyLauren Fiedman, Student-MidWife 03/23/2016, 12:01 PM

## 2016-03-23 NOTE — Anesthesia Postprocedure Evaluation (Signed)
Anesthesia Post Note  Patient: Shannon ScottChelsea R Pletz  Procedure(s) Performed: * No procedures listed *  Patient location during evaluation: Mother Baby Anesthesia Type: Epidural Level of consciousness: awake and alert Pain management: pain level controlled Vital Signs Assessment: post-procedure vital signs reviewed and stable Respiratory status: spontaneous breathing, nonlabored ventilation and respiratory function stable Cardiovascular status: stable Postop Assessment: no headache, no backache and patient able to bend at knees Anesthetic complications: no    Last Vitals:  Vitals:   03/23/16 0349 03/23/16 0741  BP: 120/70 (!) 111/57  Pulse: 67 83  Resp: 18 18  Temp: 36.9 C 36.9 C    Last Pain:  Vitals:   03/23/16 0741  TempSrc: Oral  PainSc:                  Cleda MccreedyJoseph K Piscitello

## 2016-03-24 MED ORDER — FERROUS SULFATE 325 (65 FE) MG PO TABS: 325.0000 mg | ORAL_TABLET | Freq: Two times a day (BID) | ORAL | 3 refills | Status: DC

## 2016-03-24 MED ORDER — SENNOSIDES-DOCUSATE SODIUM 8.6-50 MG PO TABS: 2.0000 | ORAL_TABLET | Freq: Every day | ORAL | Status: DC

## 2016-03-24 MED ORDER — OXYCODONE HCL 5 MG PO TABS: 5.0000 mg | ORAL_TABLET | ORAL | 0 refills | Status: DC | PRN

## 2016-03-24 MED ORDER — WITCH HAZEL-GLYCERIN EX PADS: 1.0000 "application " | MEDICATED_PAD | CUTANEOUS | 12 refills | Status: DC | PRN

## 2016-03-24 MED ORDER — IBUPROFEN 600 MG PO TABS: 600.0000 mg | ORAL_TABLET | Freq: Four times a day (QID) | ORAL | 0 refills | Status: DC | PRN

## 2017-01-10 ENCOUNTER — Other Ambulatory Visit: Payer: Self-pay | Admitting: Family Medicine

## 2017-01-10 DIAGNOSIS — R748 Abnormal levels of other serum enzymes: Secondary | ICD-10-CM

## 2017-01-20 ENCOUNTER — Ambulatory Visit
Admission: RE | Admit: 2017-01-20 | Discharge: 2017-01-20 | Disposition: A | Discharge status: Home / Self Care | Payer: BLUE CROSS/BLUE SHIELD | Source: Ambulatory Visit | Attending: Family Medicine | Admitting: Family Medicine

## 2017-01-20 DIAGNOSIS — R748 Abnormal levels of other serum enzymes: Secondary | ICD-10-CM | POA: Diagnosis present

## 2017-01-20 DIAGNOSIS — K8020 Calculus of gallbladder without cholecystitis without obstruction: Secondary | ICD-10-CM | POA: Insufficient documentation

## 2017-06-23 ENCOUNTER — Telehealth: Payer: Self-pay

## 2017-06-23 NOTE — Telephone Encounter (Signed)
Pt states she feels like she has a yeast infection that has been recurring. Requested rx from [facility] to be sent in due to she lives out of town now. Left msg for pt that she would need to be seen to make sure we were treating appropriately and that [facility] is out of the office. Also advised to try OTC monistat. Cb# [phone]

## 2017-06-25 IMAGING — CT CT ABD-PELV W/ CM
1 of 2 series · 15 of 32 positions shown, 19 images · IV contrast (omnipaque)
Comparison: None

CLINICAL DATA: Right lower quadrant pain for 2 days

EXAM:
CT ABDOMEN AND PELVIS WITH CONTRAST
TECHNIQUE: Multidetector CT imaging of the abdomen and pelvis was performed
using the standard protocol following bolus administration of
intravenous contrast.
CONTRAST:  100mL OMNIPAQUE IOHEXOL 300 MG/ML  SOLN

[Series 2: routine abd pel with · axial · 0.68mm/px · z∈[-1007,-592]mm · 15 of 91 slices shown, 19 images]
[im 4/91  soft-tissue]
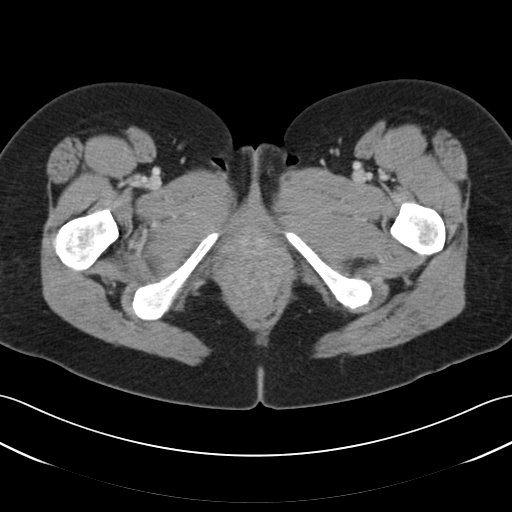
[im 4/91  bone]
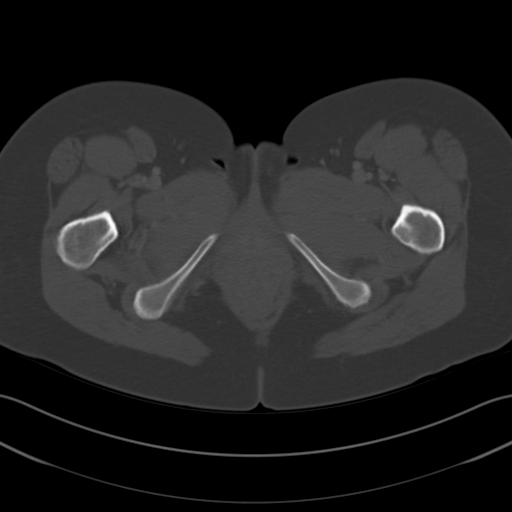
[im 12/91  soft-tissue]
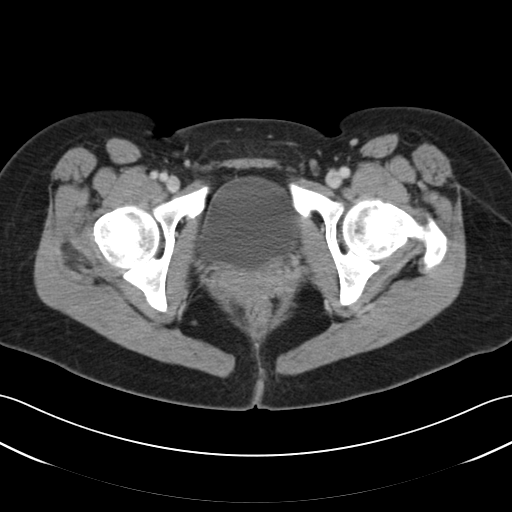
[im 19/91  soft-tissue]
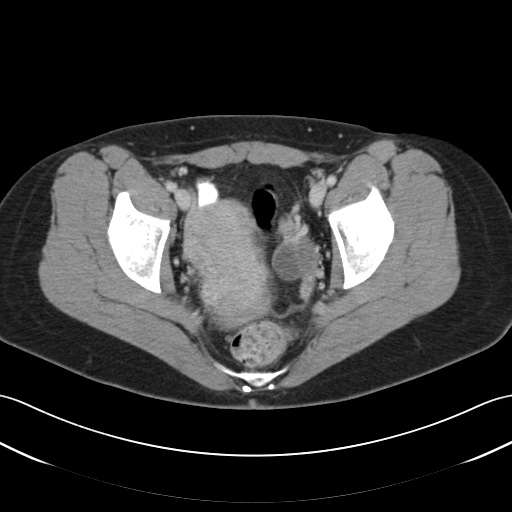
[im 27/91  soft-tissue]
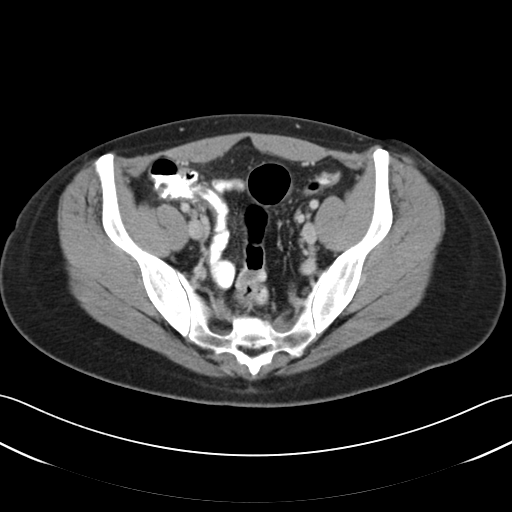
[im 31/91  soft-tissue]
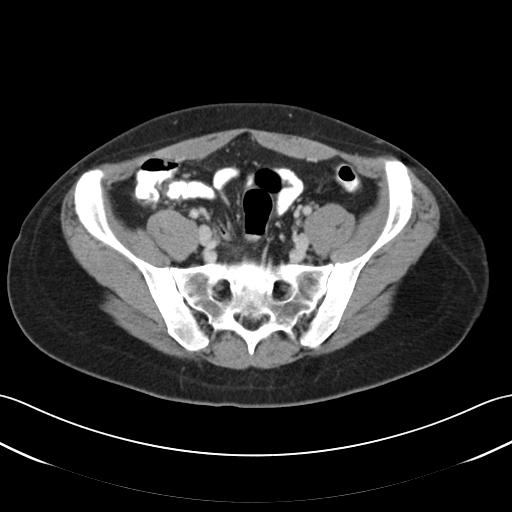
[im 38/91  soft-tissue]
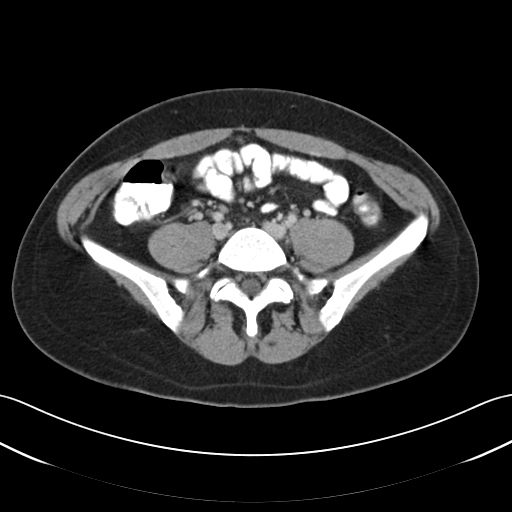
[im 46/91  soft-tissue]
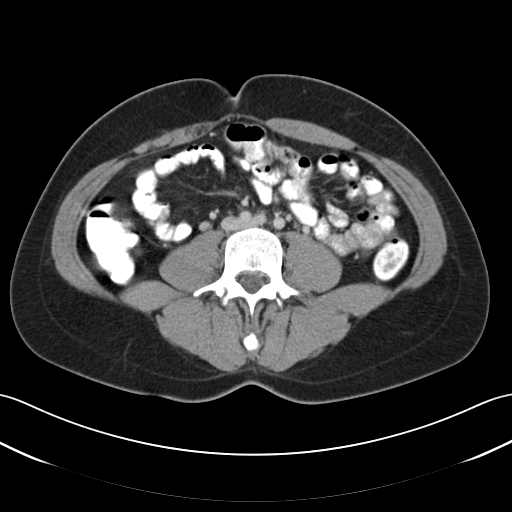
[im 53/91  soft-tissue]
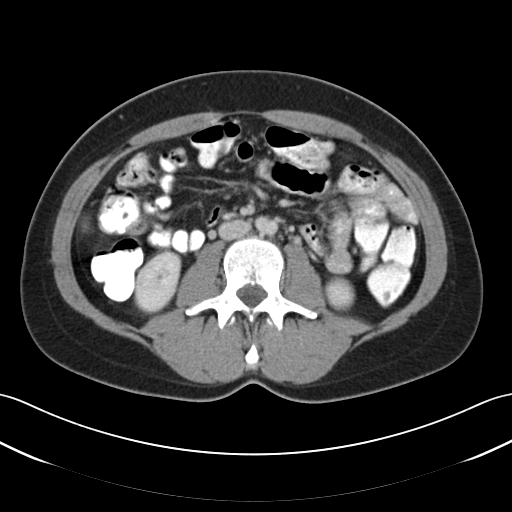
[im 61/91  soft-tissue]
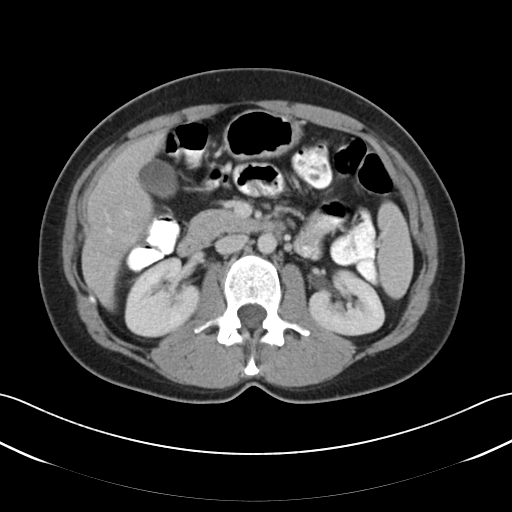
[im 61/91  bone]
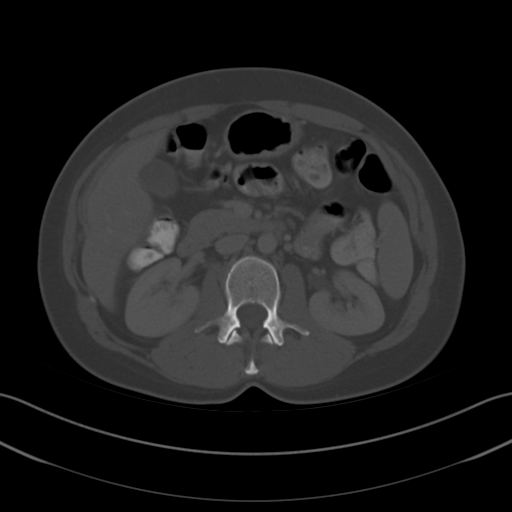
[im 64/91  soft-tissue]
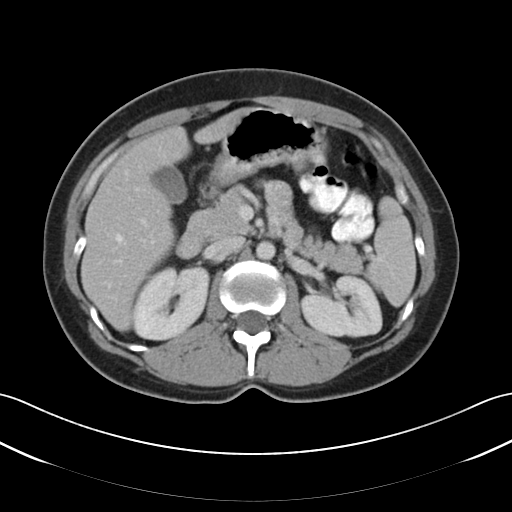
[im 72/91  soft-tissue]
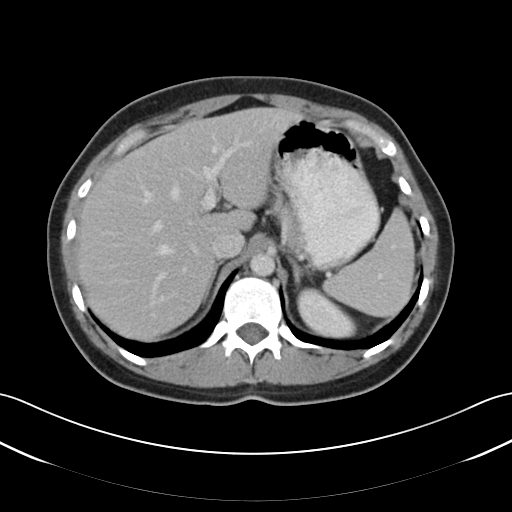
[im 76/91  lung]
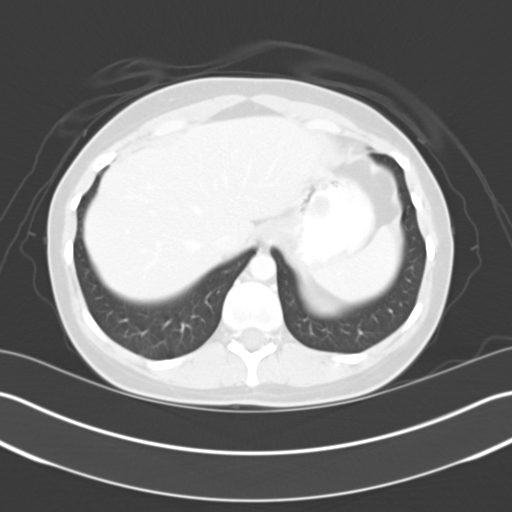
[im 79/91  soft-tissue]
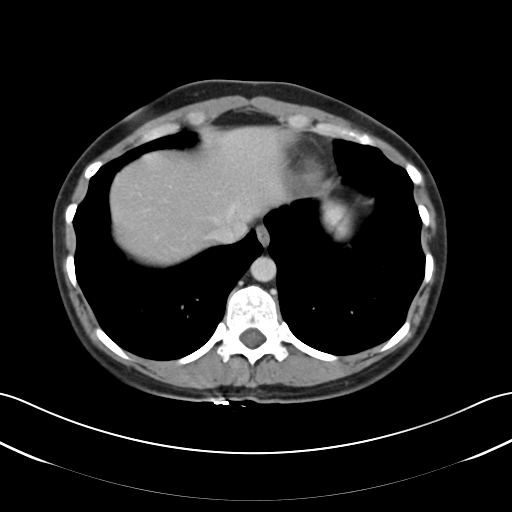
[im 79/91  lung]
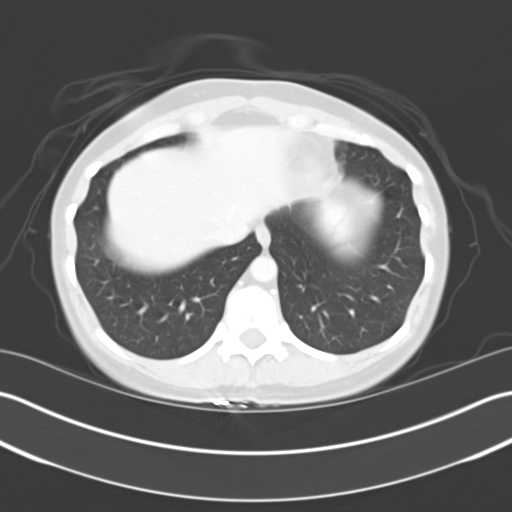
[im 83/91  lung]
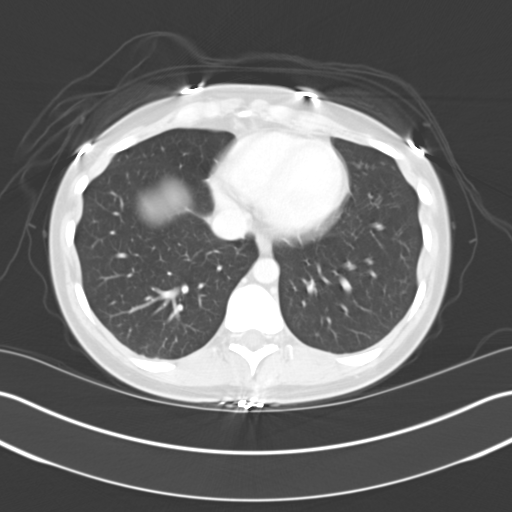
[im 87/91  soft-tissue]
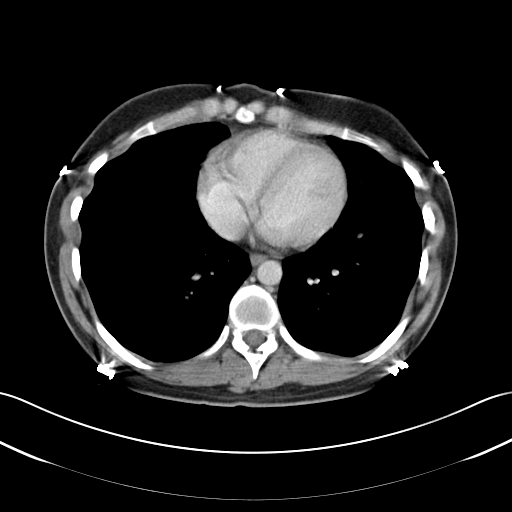
[im 87/91  lung]
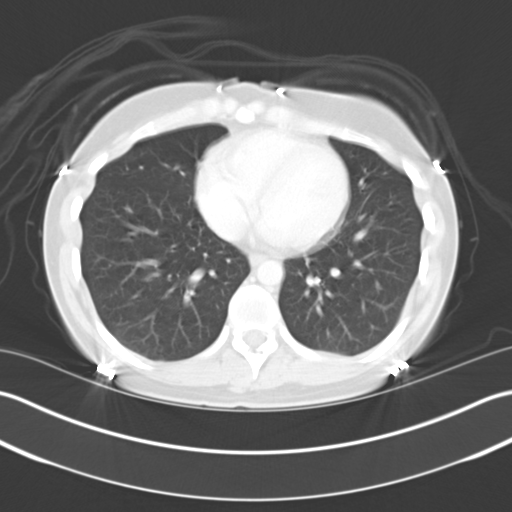

[15 of 32 positions shown; findings below may reference images not displayed]

FINDINGS: Lower chest: No pleural effusion identified. The lung bases appear
clear.

Hepatobiliary: No suspicious liver abnormality identified. The
gallbladder appears within normal limits. There is no biliary
dilatation identified.

Pancreas: Negative

Spleen: Negative

Adrenals/Urinary Tract: The adrenal glands are normal. Unremarkable
appearance of both kidneys. The urinary bladder appears within
normal limits.

Stomach/Bowel: The stomach and the small bowel loops have a normal
course and caliber. The appendix is visualized and appears normal.
No pathologic dilatation of the large bowel loops.

Vascular/Lymphatic: Normal appearance of the abdominal aorta. No
enlarged retroperitoneal or mesenteric adenopathy. No enlarged
pelvic or inguinal lymph nodes.

Reproductive: The uterus appears normal. Normal physiologic
appearance of the ovaries. Cyst in left ovary measures 2 cm.

Other: No free fluid or fluid collections identified.

Musculoskeletal: Review of the visualized bony structures is
negative.
IMPRESSION: 1. No acute findings identified within the abdomen or pelvis.
2. The appendix is visualized in the right lower quadrant and
appears normal.

## 2018-11-19 IMAGING — US US ABDOMEN LIMITED
1 series · 14 of 25 positions shown · non-contrast
Comparison: CT 06/09/2015 .

CLINICAL DATA: Elevated liver enzymes.

EXAM:
ULTRASOUND ABDOMEN LIMITED RIGHT UPPER QUADRANT

[Series 1: us abdomen limited · 0.19mm/px · 14 of 61 slices shown]
[im 1/61]
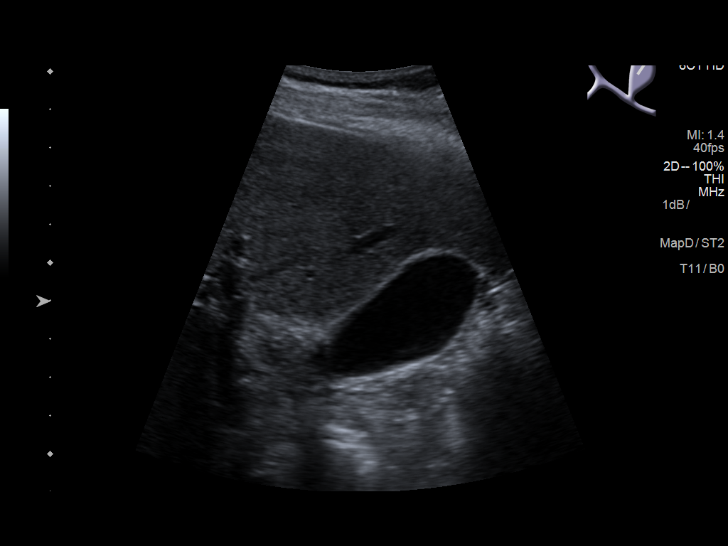
[im 6/61]
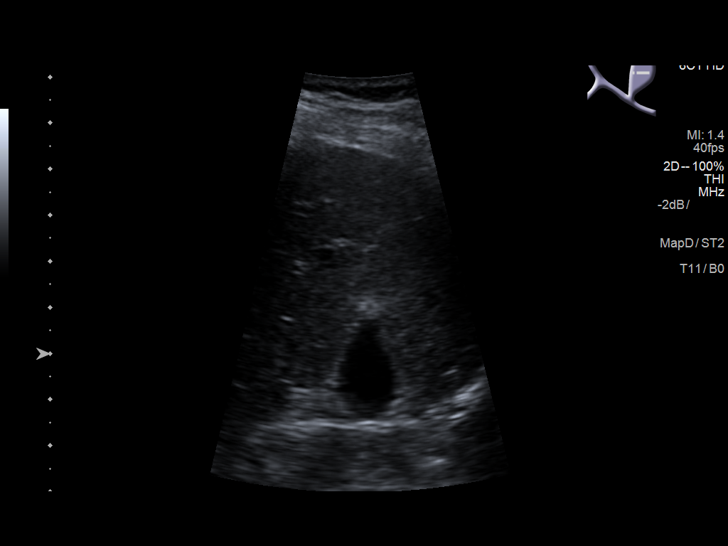
[im 11/61]
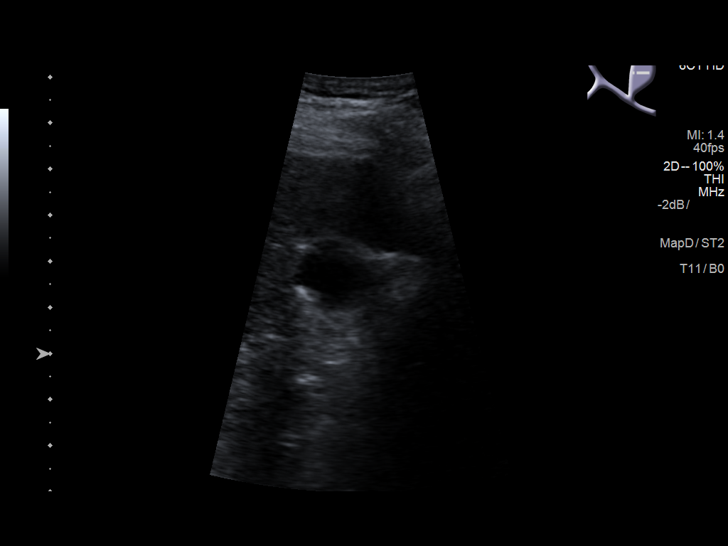
[im 16/61]
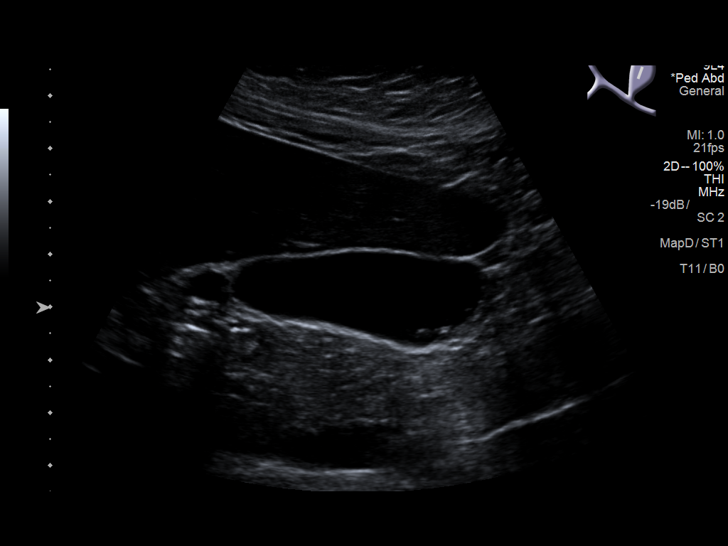
[im 21/61]
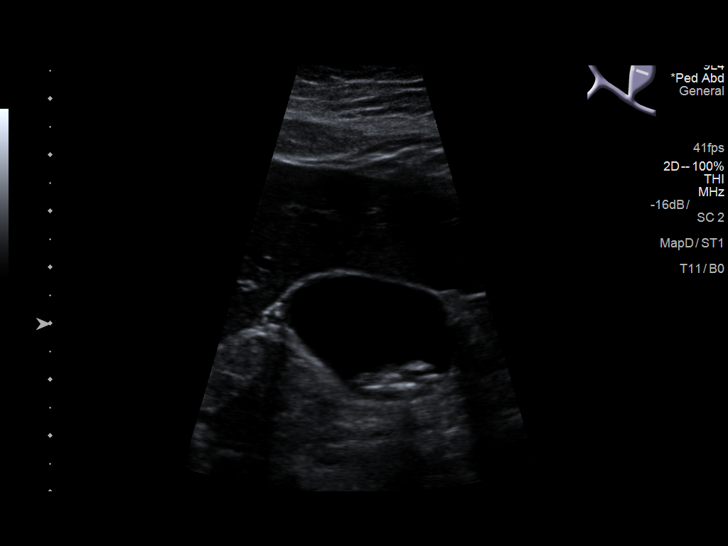
[im 23/61]
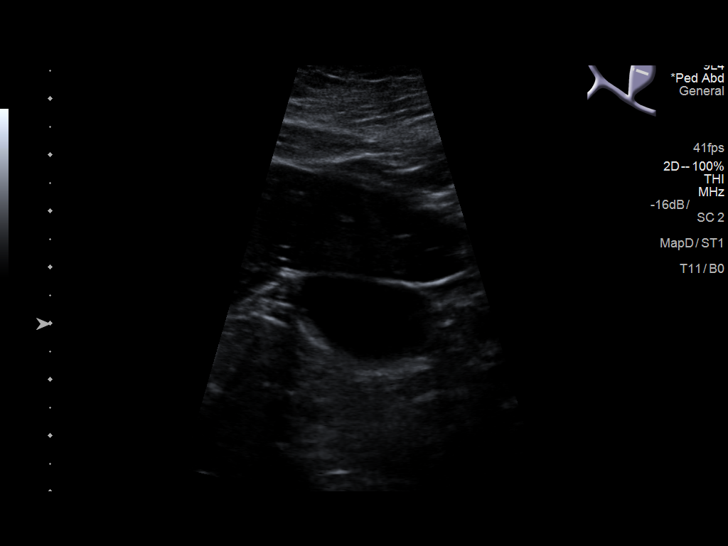
[im 28/61]
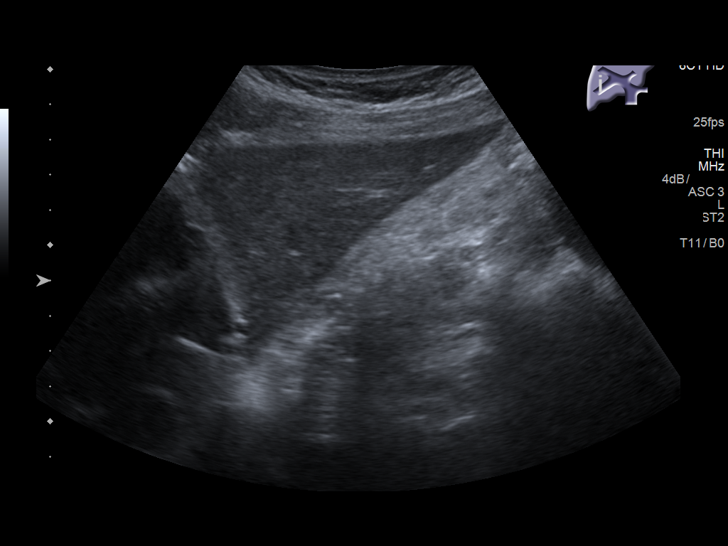
[im 33/61]
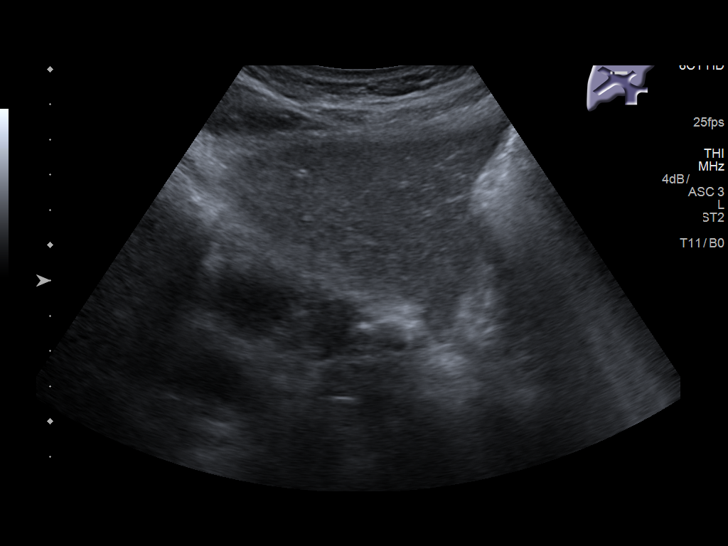
[im 38/61]
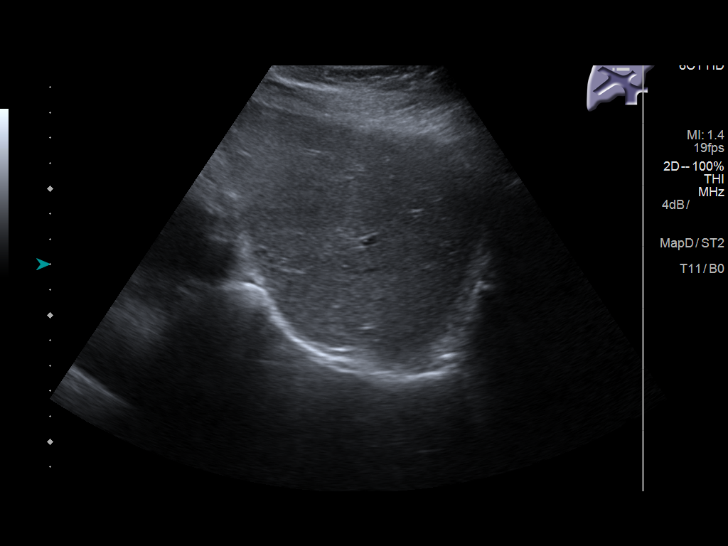
[im 41/61]
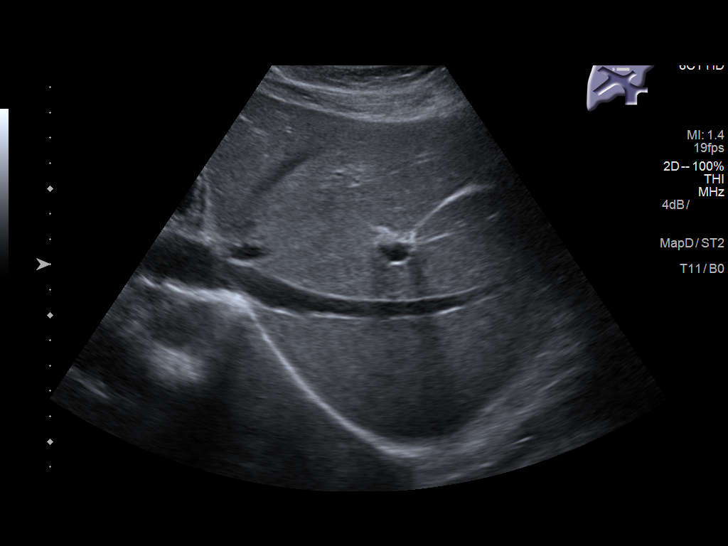
[im 46/61]
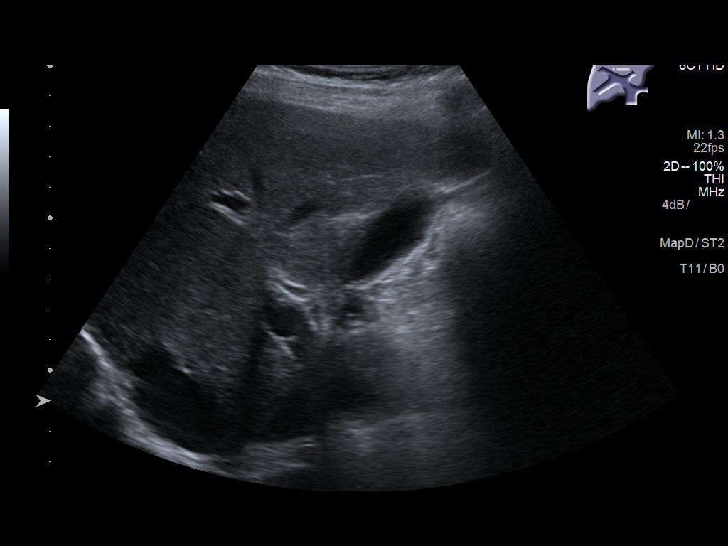
[im 51/61]
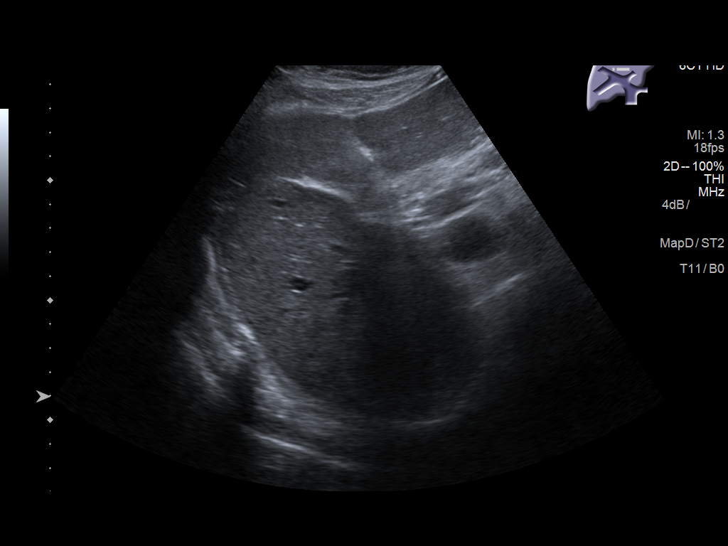
[im 56/61]
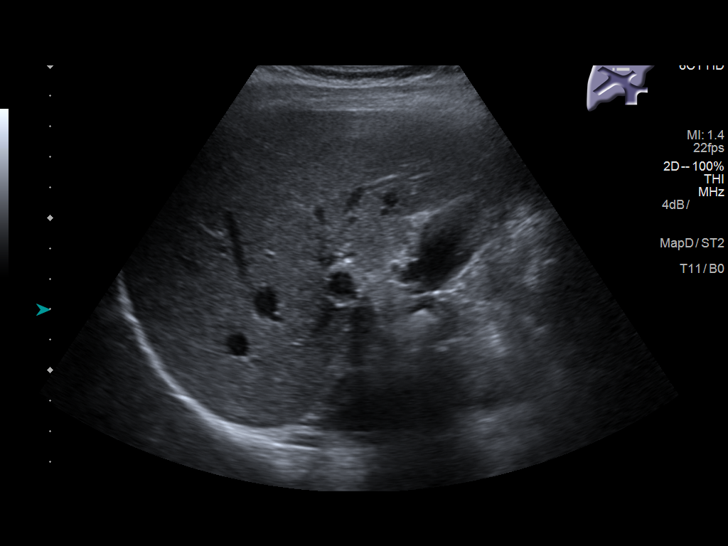
[im 61/61]
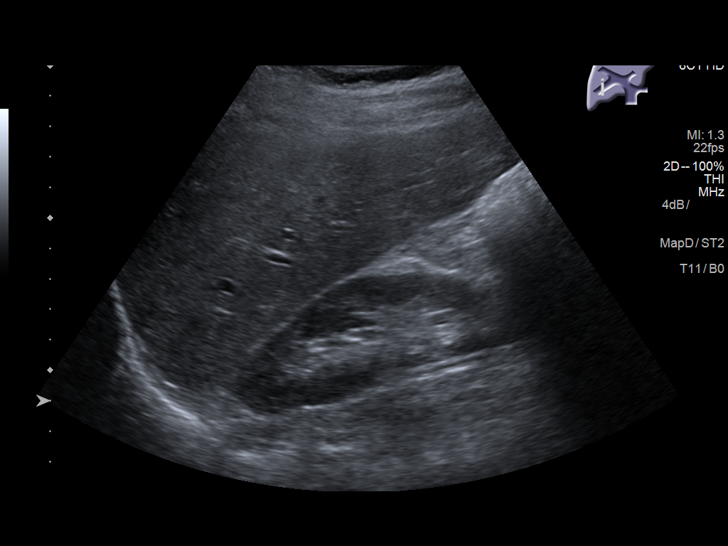

[14 of 25 positions shown; findings below may reference images not displayed]

FINDINGS: Gallbladder:

Multiple gallstones. Largest measures 6.5 mm. Gallbladder wall
thickness 2.3 mm. Negative Murphy sign.

Common bile duct:

Diameter: 2.8 mm

Liver:

No focal lesion identified. Within normal limits in parenchymal
echogenicity. Portal vein is patent on color Doppler imaging with
normal direction of blood flow towards the liver.
IMPRESSION: Multiple gallstones.  No evidence of cholecystitis.

## 2020-01-11 ENCOUNTER — Other Ambulatory Visit: Payer: Self-pay | Admitting: Family Medicine

## 2020-01-11 DIAGNOSIS — R748 Abnormal levels of other serum enzymes: Secondary | ICD-10-CM

## 2020-01-14 ENCOUNTER — Ambulatory Visit
Admission: RE | Admit: 2020-01-14 | Discharge: 2020-01-14 | Disposition: A | Discharge status: Home / Self Care | Payer: BC Managed Care – PPO | Source: Ambulatory Visit | Attending: Family Medicine | Admitting: Family Medicine

## 2020-01-14 ENCOUNTER — Other Ambulatory Visit: Payer: Self-pay

## 2020-01-14 DIAGNOSIS — R748 Abnormal levels of other serum enzymes: Secondary | ICD-10-CM | POA: Diagnosis present

## 2020-01-18 ENCOUNTER — Ambulatory Visit: Payer: BLUE CROSS/BLUE SHIELD

## 2020-10-25 DIAGNOSIS — M9902 Segmental and somatic dysfunction of thoracic region: Secondary | ICD-10-CM | POA: Diagnosis not present

## 2020-10-25 DIAGNOSIS — M9901 Segmental and somatic dysfunction of cervical region: Secondary | ICD-10-CM | POA: Diagnosis not present

## 2020-10-25 DIAGNOSIS — M5383 Other specified dorsopathies, cervicothoracic region: Secondary | ICD-10-CM | POA: Diagnosis not present

## 2020-10-25 DIAGNOSIS — M542 Cervicalgia: Secondary | ICD-10-CM | POA: Diagnosis not present

## 2021-02-06 ENCOUNTER — Ambulatory Visit (INDEPENDENT_AMBULATORY_CARE_PROVIDER_SITE_OTHER): Payer: BC Managed Care – PPO | Admitting: Obstetrics and Gynecology

## 2021-02-06 ENCOUNTER — Other Ambulatory Visit (HOSPITAL_COMMUNITY)
Admission: RE | Admit: 2021-02-06 | Discharge: 2021-02-06 | Disposition: A | Discharge status: Home / Self Care | Payer: BC Managed Care – PPO | Source: Ambulatory Visit | Attending: Obstetrics and Gynecology | Admitting: Obstetrics and Gynecology

## 2021-02-06 ENCOUNTER — Other Ambulatory Visit: Payer: Self-pay

## 2021-02-06 ENCOUNTER — Encounter: Payer: Self-pay | Admitting: Obstetrics and Gynecology

## 2021-02-06 VITALS — BP 120/60 | Ht 64.0 in | Wt 151.0 lb

## 2021-02-06 DIAGNOSIS — K644 Residual hemorrhoidal skin tags: Secondary | ICD-10-CM

## 2021-02-06 DIAGNOSIS — N393 Stress incontinence (female) (male): Secondary | ICD-10-CM

## 2021-02-06 DIAGNOSIS — R5383 Other fatigue: Secondary | ICD-10-CM

## 2021-02-06 DIAGNOSIS — Z124 Encounter for screening for malignant neoplasm of cervix: Secondary | ICD-10-CM

## 2021-02-06 DIAGNOSIS — Z803 Family history of malignant neoplasm of breast: Secondary | ICD-10-CM | POA: Diagnosis not present

## 2021-02-06 DIAGNOSIS — Z1151 Encounter for screening for human papillomavirus (HPV): Secondary | ICD-10-CM | POA: Insufficient documentation

## 2021-02-06 DIAGNOSIS — Z01419 Encounter for gynecological examination (general) (routine) without abnormal findings: Secondary | ICD-10-CM | POA: Diagnosis not present

## 2021-02-06 NOTE — Progress Notes (Signed)
PCP:  Kandyce Rud, MD   Chief Complaint  Patient presents with   Gynecologic Exam    Fatigue x 1 year     HPI:      [name] is a 35 y.o. [id number] whose LMP was Patient's last menstrual period was 01/08/2021 (approximate)., presents today for her NP> 3 yrs annual examination.  Her menses are regular every 28-30 days, lasting 4-5 days.  Dysmenorrhea mild, occurring first 1-2 days of flow. She does not have intermenstrual bleeding.  Sex activity: single partner, contraception - vasectomy.  Last Pap: 11/01/14 Results were: no abnormalities   Hx of STDs: HPV on pap in 2010  There is a FH of breast cancer in her PGM, genetic testing not done. There is no FH of ovarian cancer. The patient does do self-breast exams. Has bilat implants  Tobacco use: The patient denies current or previous tobacco use. Alcohol use: social drinker No drug use.  Exercise: very active  She does get adequate calcium but not Vitamin D in her diet. Has had issues with fatigue the past yr, mostly wanting to take a nap in afternoon. Sleeps about 7-8 hrs nightly, exercises a lot. Is on effexor for anxiety with sx improvement but wonders if causes fatigue.   Has issues with external hemorrhoid since pregnancy and hasn't gone away. Occas with sx, but not currently with sx.   Also with SUI. Hasn't tried kegels.   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    Past Surgical History:  Procedure Laterality Date   BREAST ENHANCEMENT SURGERY      Family History  Problem Relation Age of Onset   Diabetes Maternal Grandmother    Lung cancer Maternal Grandfather    Breast cancer Paternal Grandmother    Diabetes Paternal Grandmother     Social History   Socioeconomic History   Marital status: Married    Spouse name: Not on file   Number of children: Not on file   Years of education: Not on file   Highest education level: Not on file   Occupational History   Not on file  Tobacco Use   Smoking status: Former    Types: Cigarettes    Quit date: 03/14/2011    Years since quitting: 9.9   Smokeless tobacco: Never  Vaping Use   Vaping Use: Never used  Substance and Sexual Activity   Alcohol use: No   Drug use: No   Sexual activity: Yes    Birth control/protection: Surgical    Comment: Vasectomy  Other Topics Concern   Not on file  Social History Narrative   Not on file   Social Determinants of Health   Financial Resource Strain: Not on file  Food Insecurity: Not on file  Transportation Needs: Not on file  Physical Activity: Not on file  Stress: Not on file  Social Connections: Not on file  Intimate Partner Violence: Not on file     Current Outpatient Medications:    venlafaxine (EFFEXOR) 100 MG tablet, Take 100 mg by mouth 2 (two) times daily., Disp: , Rfl:      ROS:  Review of Systems  Constitutional:  Positive for fatigue. Negative for fever and unexpected weight change.  Respiratory:  Negative for cough, shortness of breath and wheezing.   Cardiovascular:  Negative for chest pain, palpitations and leg swelling.  Gastrointestinal:  Negative for blood in stool, constipation, diarrhea,  nausea and vomiting.  Endocrine: Negative for cold intolerance, heat intolerance and polyuria.  Genitourinary:  Negative for dyspareunia, dysuria, flank pain, frequency, genital sores, hematuria, menstrual problem, pelvic pain, urgency, vaginal bleeding, vaginal discharge and vaginal pain.  Musculoskeletal:  Negative for back pain, joint swelling and myalgias.  Skin:  Negative for rash.  Neurological:  Negative for dizziness, syncope, light-headedness, numbness and headaches.  Hematological:  Negative for adenopathy.  Psychiatric/Behavioral:  Positive for agitation. Negative for confusion, sleep disturbance and suicidal ideas. The patient is not nervous/anxious.   BREAST: No symptoms   Objective: BP 120/60   Ht 5'  4" (1.626 m)   Wt 151 lb (68.5 kg)   LMP 01/08/2021 (Approximate)   Breastfeeding No   BMI 25.92 kg/m    Physical Exam Constitutional:      Appearance: She is well-developed.  Genitourinary:     Vulva normal.     Right Labia: No rash, tenderness or lesions.    Left Labia: No tenderness, lesions or rash.    No vaginal discharge, erythema or tenderness.      Right Adnexa: not tender and no mass present.    Left Adnexa: not tender and no mass present.    No cervical friability or polyp.     Uterus is not enlarged or tender.  Rectum:     External hemorrhoid present.  Breasts:    Right: No mass, nipple discharge, skin change or tenderness.     Left: No mass, nipple discharge, skin change or tenderness.  Neck:     Thyroid: No thyromegaly.  Cardiovascular:     Rate and Rhythm: Normal rate and regular rhythm.     Heart sounds: Normal heart sounds. No murmur heard. Pulmonary:     Effort: Pulmonary effort is normal.     Breath sounds: Normal breath sounds.  Abdominal:     Palpations: Abdomen is soft.     Tenderness: There is no abdominal tenderness. There is no guarding or rebound.  Musculoskeletal:        General: Normal range of motion.     Cervical back: Normal range of motion.  Lymphadenopathy:     Cervical: No cervical adenopathy.  Neurological:     General: No focal deficit present.     Mental Status: She is alert and oriented to person, place, and time.     Cranial Nerves: No cranial nerve deficit.  Skin:    General: Skin is warm and dry.  Psychiatric:        Mood and Affect: Mood normal.        Behavior: Behavior normal.        Thought Content: Thought content normal.        Judgment: Judgment normal.  Vitals reviewed.    Assessment/Plan: Encounter for annual routine gynecological examination  Cervical cancer screening - Plan: Cytology - PAP  Screening for HPV (human papillomavirus) - Plan: Cytology - PAP  Family history of breast cancer--MyRisk testing  discussed and handout given. F/u prn.   Other fatigue--increase sleep, add Vit D, see if sx improve.   External hemorrhoid--keep stools soft to prevent flare; OTC products prn.   SUI--add kegels/pelvic floor exercises. F/u prn.            GYN counsel adequate intake of calcium and vitamin D, diet and exercise     F/U  Return in about 1 year (around 02/06/2022).  Giulian Goldring B. Cruz Bong, PA-C 02/06/2021 4:03 PM

## 2021-02-06 NOTE — Patient Instructions (Signed)
I value your feedback and you entrusting us with your care. If you get a [facility] patient survey, I would appreciate you taking the time to let us know about your experience today. Thank you! ? ? ?

## 2021-02-12 LAB — CYTOLOGY - PAP
Comment: NEGATIVE
Diagnosis: NEGATIVE
High risk HPV: NEGATIVE

## 2021-04-05 DIAGNOSIS — Z23 Encounter for immunization: Secondary | ICD-10-CM | POA: Diagnosis not present

## 2021-04-05 DIAGNOSIS — F419 Anxiety disorder, unspecified: Secondary | ICD-10-CM | POA: Diagnosis not present

## 2021-04-05 DIAGNOSIS — R5383 Other fatigue: Secondary | ICD-10-CM | POA: Diagnosis not present

## 2021-04-13 DIAGNOSIS — E875 Hyperkalemia: Secondary | ICD-10-CM | POA: Diagnosis not present

## 2022-01-30 IMAGING — US US ABDOMEN LIMITED
1 series · 14 of 25 positions shown · non-contrast
Comparison: Abdominal ultrasound 01/20/2017

CLINICAL DATA: Elevated liver enzymes

EXAM:
ULTRASOUND ABDOMEN LIMITED RIGHT UPPER QUADRANT

[Series 1: us abdomen limited · 0.18mm/px · 14 of 46 slices shown]
[im 1/46]
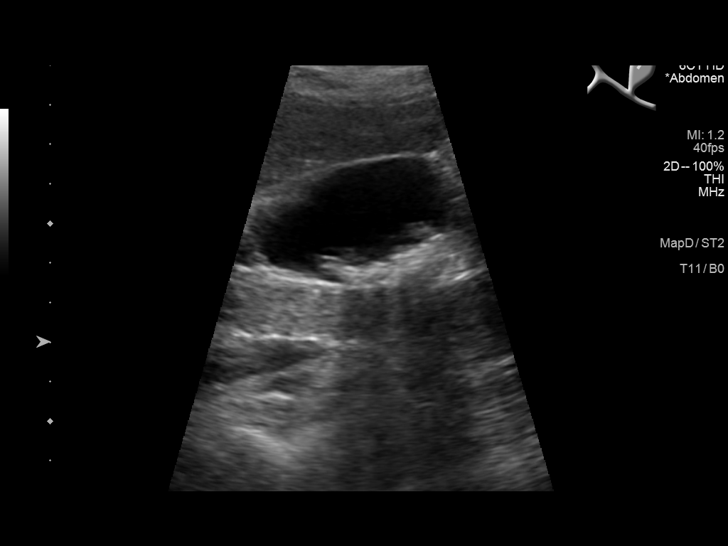
[im 4/46]
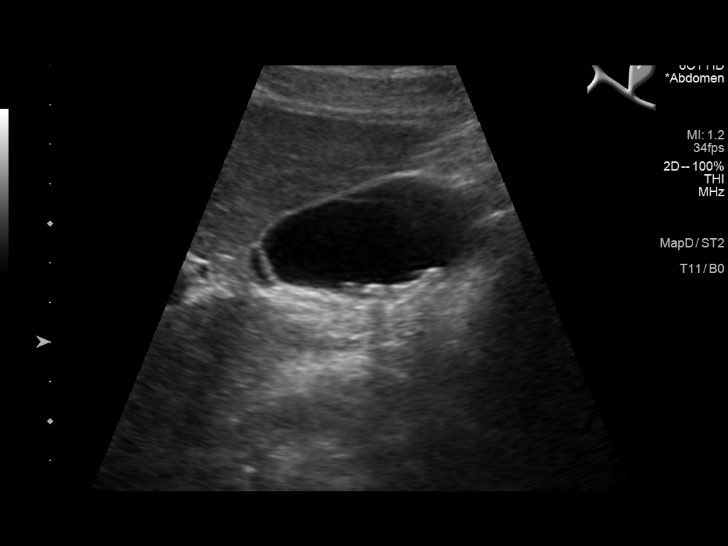
[im 8/46]
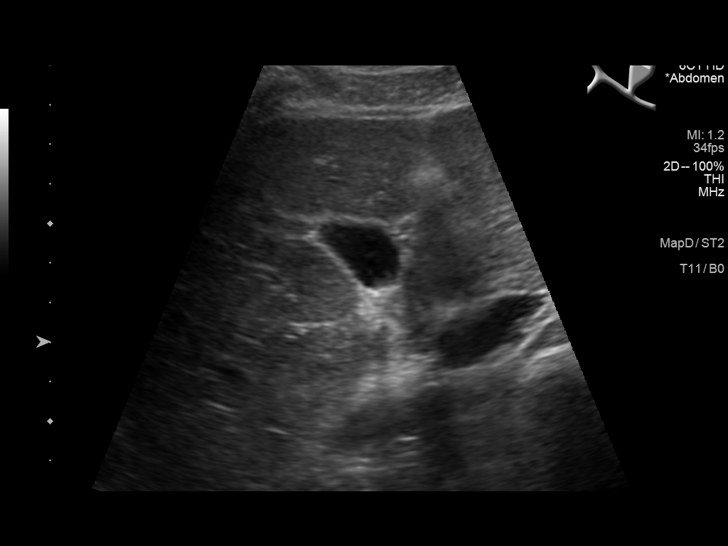
[im 12/46]
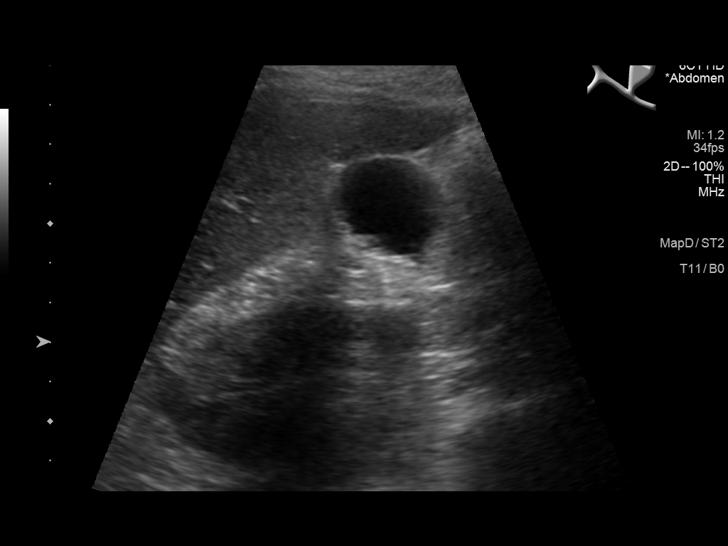
[im 16/46]
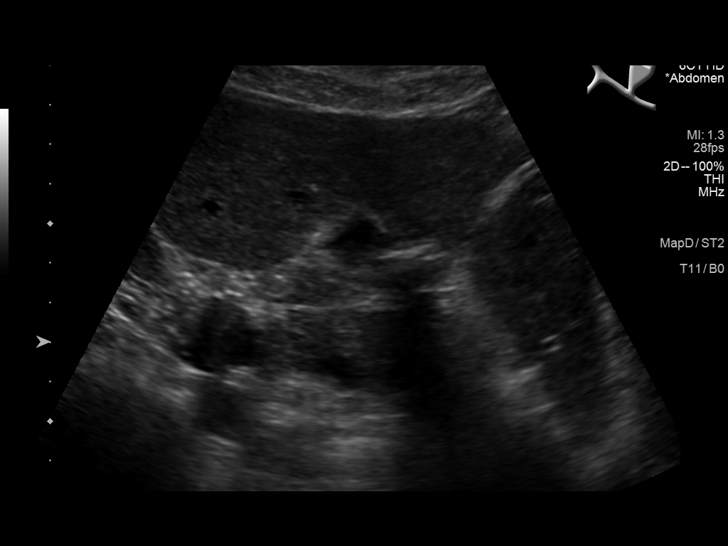
[im 17/46]
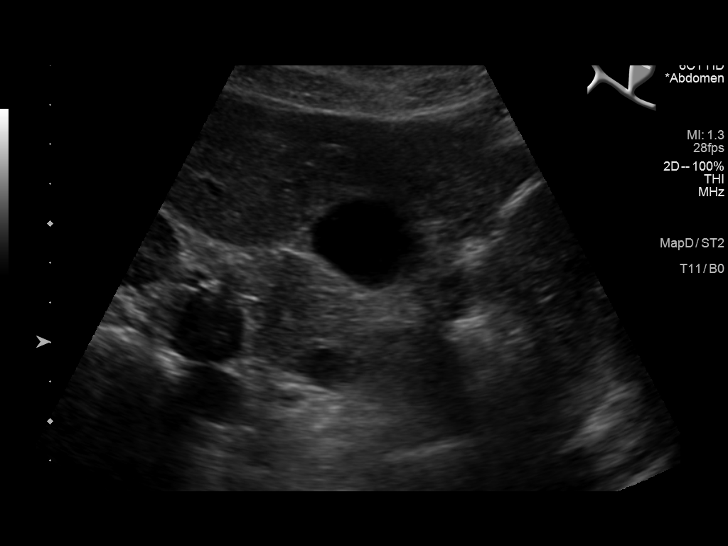
[im 21/46]
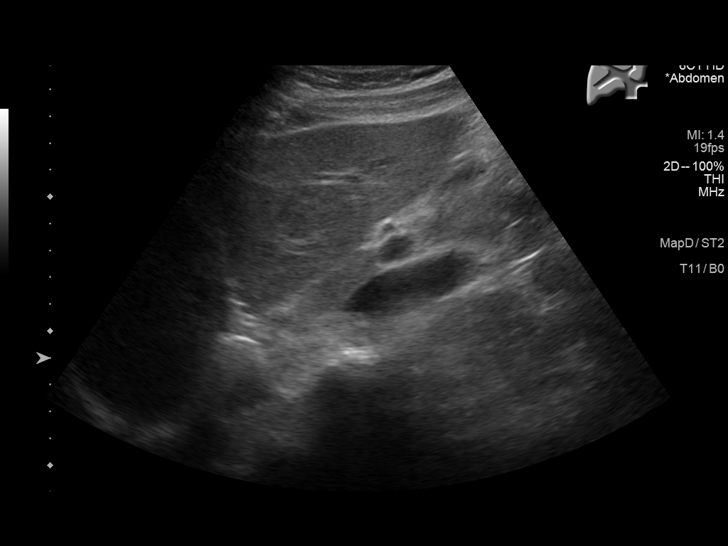
[im 25/46]
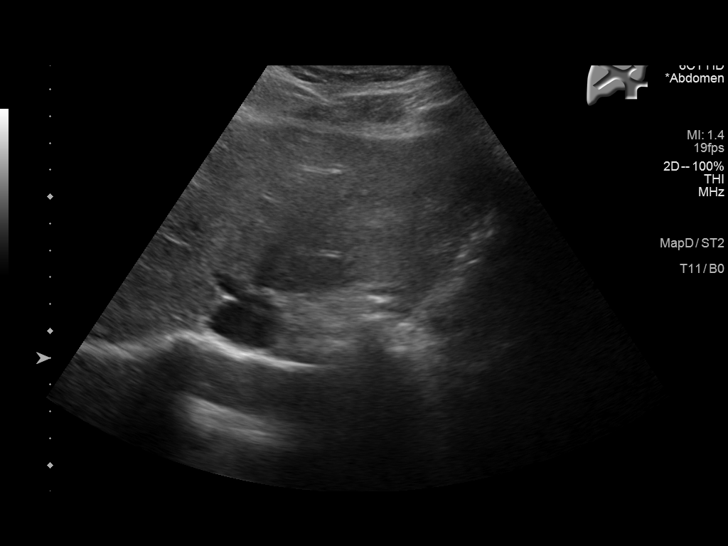
[im 29/46]
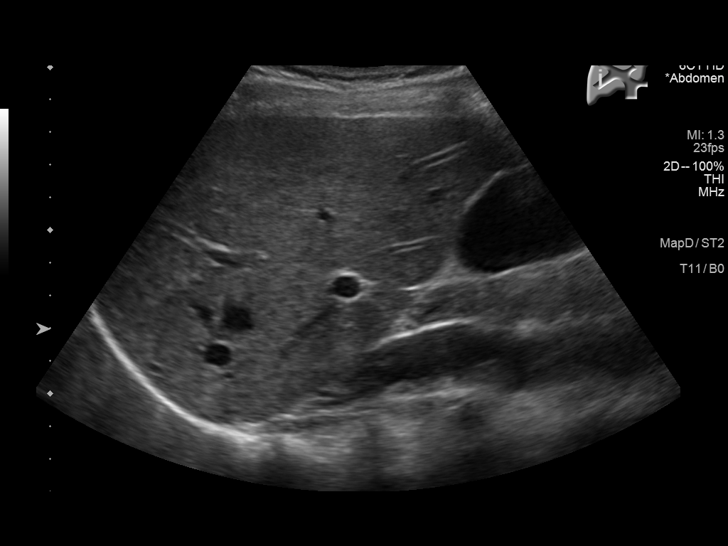
[im 31/46]
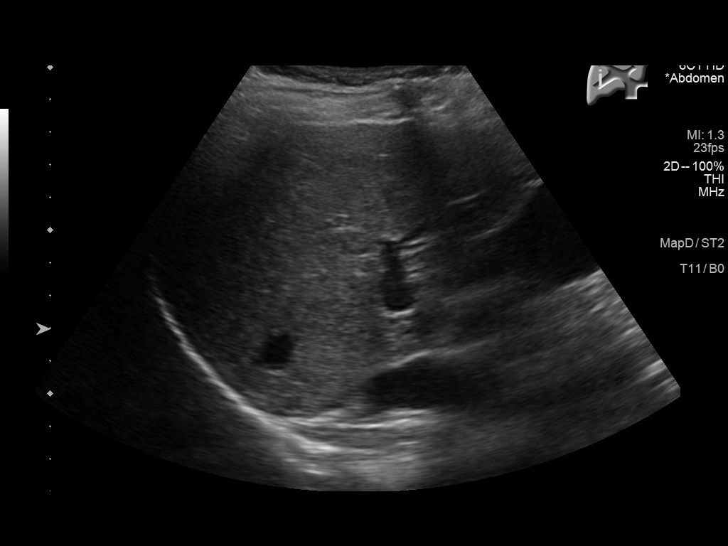
[im 34/46]
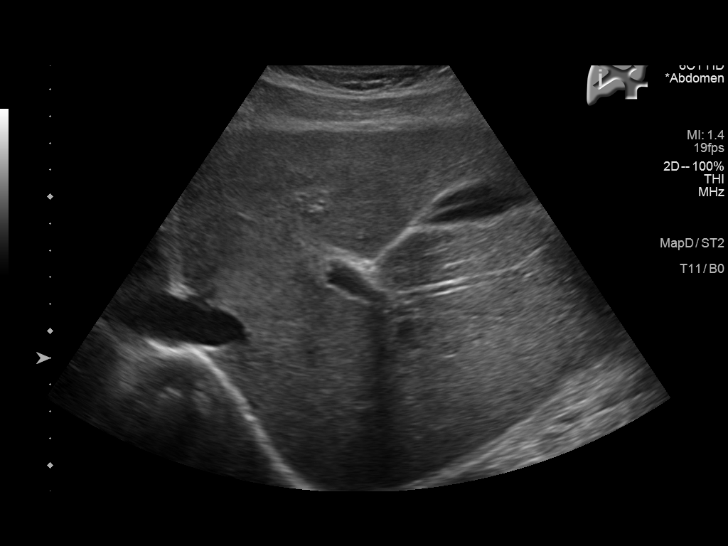
[im 38/46]
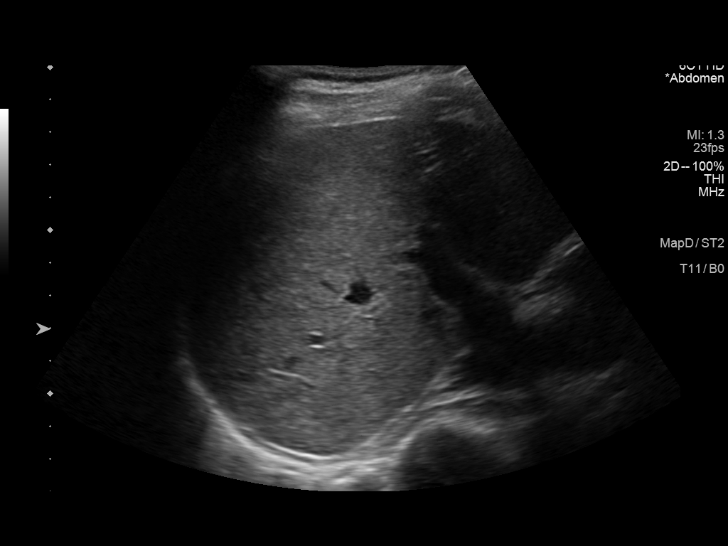
[im 42/46]
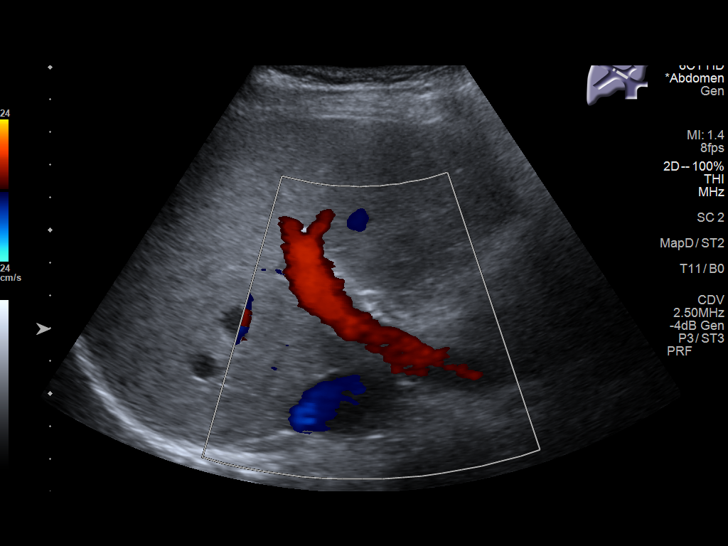
[im 46/46]
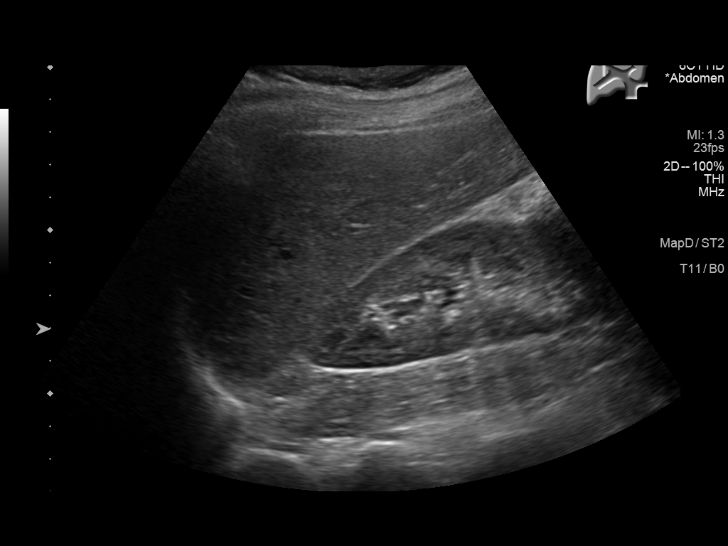

[14 of 25 positions shown; findings below may reference images not displayed]

FINDINGS: Gallbladder:

Multiple small gallstones. No gallbladder wall thickening. No
sonographic Murphy sign noted by sonographer.

Common bile duct:

Diameter: 0.3 cm, within normal limits.

Liver:

No focal lesion identified. Within normal limits in parenchymal
echogenicity. Portal vein is patent on color Doppler imaging with
normal direction of blood flow towards the liver.

Other: None.
IMPRESSION: Cholelithiasis. Otherwise unremarkable right upper quadrant
ultrasound.

## 2022-05-30 DIAGNOSIS — Z13220 Encounter for screening for lipoid disorders: Secondary | ICD-10-CM | POA: Diagnosis not present

## 2022-05-30 DIAGNOSIS — Z1331 Encounter for screening for depression: Secondary | ICD-10-CM | POA: Diagnosis not present

## 2022-05-30 DIAGNOSIS — Z0000 Encounter for general adult medical examination without abnormal findings: Secondary | ICD-10-CM | POA: Diagnosis not present

## 2022-05-30 DIAGNOSIS — Z79899 Other long term (current) drug therapy: Secondary | ICD-10-CM | POA: Diagnosis not present

## 2022-09-09 ENCOUNTER — Encounter: Payer: Self-pay | Admitting: Obstetrics and Gynecology

## 2022-09-09 ENCOUNTER — Telehealth: Payer: Self-pay | Admitting: Obstetrics and Gynecology

## 2022-09-09 ENCOUNTER — Ambulatory Visit (INDEPENDENT_AMBULATORY_CARE_PROVIDER_SITE_OTHER): Payer: BC Managed Care – PPO | Admitting: Obstetrics and Gynecology

## 2022-09-09 VITALS — BP 104/76 | Ht 64.0 in | Wt 145.0 lb

## 2022-09-09 DIAGNOSIS — N816 Rectocele: Secondary | ICD-10-CM

## 2022-09-09 DIAGNOSIS — N8189 Other female genital prolapse: Secondary | ICD-10-CM

## 2022-09-09 NOTE — Telephone Encounter (Signed)
Ok

## 2022-09-09 NOTE — Patient Instructions (Signed)
I value your feedback and you entrusting us with your care. If you get a [facility] patient survey, I would appreciate you taking the time to let us know about your experience today. Thank you! ? ? ?

## 2022-09-09 NOTE — Telephone Encounter (Signed)
Called pt to let her know that she was put on the schedule today at 2:35.  No answer, left message for pt.

## 2022-09-09 NOTE — Progress Notes (Signed)
Kandyce Rud, MD   Chief Complaint  Patient presents with   Vaginal Exam    HPI:      [name] is a 37 y.o. [id number] whose LMP was Patient's last menstrual period was 08/14/2022 (approximate)., presents today for prolapse eval. Has hx of constipation recently and been straining with BM. Noticed small "ball" vaginally after BM and concerned. Was able to push it back in. No fecal/urin issues due to prolapse. She is sexually active, occas with pain. On OCPs.    Patient Active Problem List   Diagnosis Date Noted   Family history of breast cancer 02/06/2021   SUI (stress urinary incontinence, female) 02/06/2021   External hemorrhoid 02/06/2021   Labor and delivery, indication for care 03/22/2016   Postpartum care following vaginal delivery 03/22/2016   Vaginal discharge during pregnancy 03/13/2016   Labor and delivery indication for care or intervention 03/08/2016   Fetal arrhythmia affecting pregnancy, antepartum 01/22/2016    Past Surgical History:  Procedure Laterality Date   BREAST ENHANCEMENT SURGERY      Family History  Problem Relation Age of Onset   Diabetes Maternal Grandmother    Lung cancer Maternal Grandfather    Breast cancer Paternal Grandmother        71s   Diabetes Paternal Grandmother     Social History   Socioeconomic History   Marital status: Married    Spouse name: Not on file   Number of children: Not on file   Years of education: Not on file   Highest education level: Not on file  Occupational History   Not on file  Tobacco Use   Smoking status: Former    Types: Cigarettes    Quit date: 03/14/2011    Years since quitting: 11.4   Smokeless tobacco: Never  Vaping Use   Vaping Use: Never used  Substance and Sexual Activity   Alcohol use: No   Drug use: No   Sexual activity: Yes    Birth control/protection: Surgical, Pill    Comment: Vasectomy  Other Topics Concern   Not on file  Social History Narrative   Not on file    Social Determinants of Health   Financial Resource Strain: Not on file  Food Insecurity: Not on file  Transportation Needs: Not on file  Physical Activity: Not on file  Stress: Not on file  Social Connections: Not on file  Intimate Partner Violence: Not on file    Outpatient Medications Prior to Visit  Medication Sig Dispense Refill   ESTARYLLA 0.25-35 MG-MCG tablet Take 1 tablet by mouth daily.     venlafaxine XR (EFFEXOR-XR) 75 MG 24 hr capsule TAKE 1 CAPSULE(75 MG) BY MOUTH EVERY DAY     venlafaxine (EFFEXOR) 100 MG tablet Take 100 mg by mouth 2 (two) times daily.     No facility-administered medications prior to visit.      ROS:  Review of Systems  Constitutional:  Negative for fever.  Gastrointestinal:  Negative for blood in stool, constipation, diarrhea, nausea and vomiting.  Genitourinary:  Negative for dyspareunia, dysuria, flank pain, frequency, hematuria, urgency, vaginal bleeding, vaginal discharge and vaginal pain.  Musculoskeletal:  Negative for back pain.  Skin:  Negative for rash.   BREAST: No symptoms   OBJECTIVE:   Vitals:  BP 104/76   Ht 5\' 4"  (1.626 m)   Wt 145 lb (65.8 kg)   LMP 08/14/2022 (Approximate)   BMI 24.89 kg/m   Physical Exam Vitals reviewed.  Constitutional:      Appearance: She is well-developed.  Pulmonary:     Effort: Pulmonary effort is normal.  Genitourinary:    General: Normal vulva.     Pubic Area: No rash.      Labia:        Right: No rash, tenderness or lesion.        Left: No rash, tenderness or lesion.      Vagina: Normal. No vaginal discharge, erythema or tenderness.     Cervix: Normal.     Uterus: Normal. Not enlarged and not tender.      Adnexa: Right adnexa normal and left adnexa normal.       Right: No mass or tenderness.         Left: No mass or tenderness.       Comments: SMALL CYSTOCELE AND RECTOCELE WITH VALSALVA; NO SX WHEN RELAXED Musculoskeletal:        General: Normal range of motion.      Cervical back: Normal range of motion.  Skin:    General: Skin is warm and dry.  Neurological:     General: No focal deficit present.     Mental Status: She is alert and oriented to person, place, and time.  Psychiatric:        Mood and Affect: Mood normal.        Behavior: Behavior normal.        Thought Content: Thought content normal.        Judgment: Judgment normal.    Assessment/Plan: Pelvic relaxation - Plan: Ambulatory referral to Physical Therapy; small cystocele/rectocele. Discussed constipation tx and refer to pelvic PT. Pt to avoid bearing down with BM. Does lift weights and needs to be careful.   Constipation--increase fiber supp, lots of water; add probiotics and stool softener. F/u prn.    Return if symptoms worsen or fail to improve.  Hilde Churchman B. Lantz Hermann, PA-C 09/09/2022 5:13 PM

## 2023-01-30 ENCOUNTER — Encounter: Payer: Self-pay | Admitting: Obstetrics and Gynecology

## 2023-02-04 NOTE — Progress Notes (Deleted)
PCP:  Kandyce Rud, MD   No chief complaint on file.    HPI:      Shannon Giles is a 37 y.o. [id number] whose LMP was No LMP recorded., presents today for her annual examination.  Her menses are regular every 28-30 days, lasting 4-5 days.  Dysmenorrhea mild, occurring first 1-2 days of flow. She does not have intermenstrual bleeding. Referred to pelvic PT 3/24 for cystocele and rectocele, hx of constipation.   Sex activity: single partner, contraception - vasectomy.  Last Pap: 02/06/21 Results were: no abnormalities /neg HPV DNA Hx of STDs: HPV on pap in 2010  There is a FH of breast cancer in her PGM, genetic testing not done. There is no FH of ovarian cancer. The patient does do self-breast exams. Has bilat implants  Tobacco use: The patient denies current or previous tobacco use. Alcohol use: social drinker No drug use.  Exercise: very active  She does get adequate calcium but not Vitamin D in her diet. Has had issues with fatigue the past yr, mostly wanting to take a nap in afternoon. Sleeps about 7-8 hrs nightly, exercises a lot. Is on effexor for anxiety with sx improvement but wonders if causes fatigue.   Has issues with external hemorrhoid since pregnancy and hasn't gone away. Occas with sx, but not currently with sx.   Also with SUI. Hasn't tried kegels.   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    Past Surgical History:  Procedure Laterality Date   BREAST ENHANCEMENT SURGERY      Family History  Problem Relation Age of Onset   Diabetes Maternal Grandmother    Lung cancer Maternal Grandfather    Breast cancer Paternal Grandmother        20s   Diabetes Paternal Grandmother     Social History   Socioeconomic History   Marital status: Married    Spouse name: Not on file   Number of children: Not on file   Years of education: Not on file   Highest education level: Not on file  Occupational  History   Not on file  Tobacco Use   Smoking status: Former    Current packs/day: 0.00    Types: Cigarettes    Quit date: 03/14/2011    Years since quitting: 11.9   Smokeless tobacco: Never  Vaping Use   Vaping status: Never Used  Substance and Sexual Activity   Alcohol use: No   Drug use: No   Sexual activity: Yes    Birth control/protection: Surgical, Pill    Comment: Vasectomy  Other Topics Concern   Not on file  Social History Narrative   Not on file   Social Determinants of Health   Financial Resource Strain: Patient Declined (05/30/2022)   Received from [facility], [facility]   Overall Financial Resource Strain (CARDIA)    Difficulty of Paying Living Expenses: Patient declined  Food Insecurity: Patient Declined (05/30/2022)   Received from [facility], [facility]   Hunger Vital Sign    Worried About Running Out of Food in the Last Year: Patient declined    Ran Out of Food in the Last Year: Patient declined  Transportation Needs: Patient Declined (05/30/2022)   Received from [facility], [facility]   PRAPARE - Transportation    In the past 12 months,  has lack of transportation kept you from medical appointments or from getting medications?: Patient declined    Lack of Transportation (Non-Medical): Patient declined  Physical Activity: Not on file  Stress: Not on file  Social Connections: Not on file  Intimate Partner Violence: Not on file     Current Outpatient Medications:    ESTARYLLA 0.25-35 MG-MCG tablet, Take 1 tablet by mouth daily., Disp: , Rfl:    venlafaxine XR (EFFEXOR-XR) 75 MG 24 hr capsule, TAKE 1 CAPSULE(75 MG) BY MOUTH EVERY DAY, Disp: , Rfl:      ROS:  Review of Systems  Constitutional:  Positive for fatigue. Negative for fever and unexpected weight change.  Respiratory:  Negative for cough, shortness of breath and wheezing.    Cardiovascular:  Negative for chest pain, palpitations and leg swelling.  Gastrointestinal:  Negative for blood in stool, constipation, diarrhea, nausea and vomiting.  Endocrine: Negative for cold intolerance, heat intolerance and polyuria.  Genitourinary:  Negative for dyspareunia, dysuria, flank pain, frequency, genital sores, hematuria, menstrual problem, pelvic pain, urgency, vaginal bleeding, vaginal discharge and vaginal pain.  Musculoskeletal:  Negative for back pain, joint swelling and myalgias.  Skin:  Negative for rash.  Neurological:  Negative for dizziness, syncope, light-headedness, numbness and headaches.  Hematological:  Negative for adenopathy.  Psychiatric/Behavioral:  Positive for agitation. Negative for confusion, sleep disturbance and suicidal ideas. The patient is not nervous/anxious.    BREAST: No symptoms   Objective: There were no vitals taken for this visit.   Physical Exam Constitutional:      Appearance: She is well-developed.  Genitourinary:     Vulva normal.     Right Labia: No rash, tenderness or lesions.    Left Labia: No tenderness, lesions or rash.    No vaginal discharge, erythema or tenderness.      Right Adnexa: not tender and no mass present.    Left Adnexa: not tender and no mass present.    No cervical friability or polyp.     Uterus is not enlarged or tender.  Rectum:     External hemorrhoid present.  Breasts:    Right: No mass, nipple discharge, skin change or tenderness.     Left: No mass, nipple discharge, skin change or tenderness.  Neck:     Thyroid: No thyromegaly.  Cardiovascular:     Rate and Rhythm: Normal rate and regular rhythm.     Heart sounds: Normal heart sounds. No murmur heard. Pulmonary:     Effort: Pulmonary effort is normal.     Breath sounds: Normal breath sounds.  Abdominal:     Palpations: Abdomen is soft.     Tenderness: There is no abdominal tenderness. There is no guarding or rebound.  Musculoskeletal:         General: Normal range of motion.     Cervical back: Normal range of motion.  Lymphadenopathy:     Cervical: No cervical adenopathy.  Neurological:     General: No focal deficit present.     Mental Status: She is alert and oriented to person, place, and time.     Cranial Nerves: No cranial nerve deficit.  Skin:    General: Skin is warm and dry.  Psychiatric:        Mood and Affect: Mood normal.        Behavior: Behavior normal.        Thought Content: Thought content normal.        Judgment: Judgment normal.  Vitals reviewed.  Assessment/Plan: Encounter for annual routine gynecological examination  Cervical cancer screening - Plan: Cytology - PAP  Screening for HPV (human papillomavirus) - Plan: Cytology - PAP  Family history of breast cancer--MyRisk testing discussed and handout given. F/u prn.   Other fatigue--increase sleep, add Vit D, see if sx improve.   External hemorrhoid--keep stools soft to prevent flare; OTC products prn.   SUI--add kegels/pelvic floor exercises. F/u prn.            GYN counsel adequate intake of calcium and vitamin D, diet and exercise     F/U  No follow-ups on file.  Erva Koke B. Feliberto Stockley, PA-C 02/04/2023 4:42 PM

## 2023-02-06 ENCOUNTER — Ambulatory Visit: Payer: BC Managed Care – PPO | Admitting: Obstetrics and Gynecology

## 2023-02-06 DIAGNOSIS — N8189 Other female genital prolapse: Secondary | ICD-10-CM

## 2023-02-06 DIAGNOSIS — Z803 Family history of malignant neoplasm of breast: Secondary | ICD-10-CM

## 2023-02-06 DIAGNOSIS — N393 Stress incontinence (female) (male): Secondary | ICD-10-CM

## 2023-02-06 DIAGNOSIS — Z01419 Encounter for gynecological examination (general) (routine) without abnormal findings: Secondary | ICD-10-CM

## 2023-03-20 ENCOUNTER — Ambulatory Visit: Payer: BC Managed Care – PPO | Admitting: Obstetrics and Gynecology

## 2023-04-10 ENCOUNTER — Ambulatory Visit: Payer: BC Managed Care – PPO | Attending: Obstetrics and Gynecology

## 2023-04-21 ENCOUNTER — Ambulatory Visit (INDEPENDENT_AMBULATORY_CARE_PROVIDER_SITE_OTHER): Payer: BC Managed Care – PPO | Admitting: Obstetrics and Gynecology

## 2023-04-21 ENCOUNTER — Encounter: Payer: Self-pay | Admitting: Obstetrics and Gynecology

## 2023-04-21 VITALS — BP 119/74 | HR 71 | Ht 64.0 in | Wt 148.0 lb

## 2023-04-21 DIAGNOSIS — N943 Premenstrual tension syndrome: Secondary | ICD-10-CM

## 2023-04-21 DIAGNOSIS — N946 Dysmenorrhea, unspecified: Secondary | ICD-10-CM

## 2023-04-21 DIAGNOSIS — N8189 Other female genital prolapse: Secondary | ICD-10-CM

## 2023-04-21 DIAGNOSIS — Z30011 Encounter for initial prescription of contraceptive pills: Secondary | ICD-10-CM

## 2023-04-21 DIAGNOSIS — Z01419 Encounter for gynecological examination (general) (routine) without abnormal findings: Secondary | ICD-10-CM

## 2023-04-21 DIAGNOSIS — N393 Stress incontinence (female) (male): Secondary | ICD-10-CM

## 2023-04-21 MED ORDER — DROSPIRENONE-ETHINYL ESTRADIOL 3-0.02 MG PO TABS: 1.0000 | ORAL_TABLET | Freq: Every day | ORAL | 0 refills | Status: DC

## 2023-04-21 NOTE — Patient Instructions (Signed)
I value your feedback and you entrusting us with your care. If you get a [facility] patient survey, I would appreciate you taking the time to let us know about your experience today. Thank you! ? ? ?

## 2023-04-21 NOTE — Progress Notes (Unsigned)
PCP:  Kandyce Rud, MD   Chief Complaint  Patient presents with  . Gynecologic Exam    Severe cramping w chills, tired and headaches when on cycle x couple of months     HPI:      [name] is a 37 y.o. [id number] whose LMP was Patient's last menstrual period was 04/16/2023 (exact date)., presents today for her annual examination.  Her menses are regular every 28-30 days, lasting 3-4 days.  Dysmenorrhea mild, occurring first 1-2 days of flow. She does not have intermenstrual bleeding. Diagnosed with small cystocele and rectocele 4/24; sx prob worse with constipation at the time. Referred to PT  Sex activity: single partner, contraception - vasectomy.  Last Pap: 02/06/21 Results were: no abnormalities /neg HPV DNA Hx of STDs: HPV on pap in 2010  There is a FH of breast cancer in her PGM, genetic testing not indicated. There is no FH of ovarian cancer. The patient does do self-breast exams. Has bilat implants  Tobacco use: The patient denies current or previous tobacco use. Alcohol use: social drinker No drug use.  Exercise: very active  She does get adequate calcium but not Vitamin D in her diet. Has had issues with fatigue the past yr, mostly wanting to take a nap in afternoon. Sleeps about 7-8 hrs nightly, exercises a lot. Is on effexor for anxiety with sx improvement but wonders if causes fatigue.   Has issues with external hemorrhoid since pregnancy and hasn't gone away. Occas with sx, but not currently with sx.   Also with SUI. Hasn't tried kegels.  Labs with PCP  Past Medical History:  Diagnosis Date  . Anxiety   . 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 BREAST ENHANCEMENT SURGERY      Family History  Problem Relation Age of Onset  . Diabetes Maternal Grandmother   . Lung cancer Maternal Grandfather   . Breast cancer Paternal Grandmother        52s  . Diabetes Paternal Grandmother      Social History   Socioeconomic History  . Marital status: Married    Spouse name: Not on file  . Number of children: Not on file  . Years of education: Not on file  . Highest education level: Not on file  Occupational History  . Not on file  Tobacco Use  . Smoking status: Former    Current packs/day: 0.00    Types: Cigarettes    Quit date: 03/14/2011    Years since quitting: 12.1  . Smokeless tobacco: Never  Vaping Use  . Vaping status: Some Days  Substance and Sexual Activity  . Alcohol use: Yes    Comment: soc  . Drug use: No  . Sexual activity: Yes    Birth control/protection: Surgical    Comment: Vasectomy  Other Topics Concern  . Not on file  Social History Narrative  . Not on file   Social Determinants of Health   Financial Resource Strain: Patient Declined (05/30/2022)   Received from [facility], [facility]   Overall Financial Resource Strain (CARDIA)   . Difficulty of Paying Living Expenses: Patient declined  Food Insecurity: Patient Declined (05/30/2022)   Received from [facility], [facility]   Hunger Vital Sign   . Worried About Programme researcher, broadcasting/film/video in the Last Year: Patient declined   . Ran  Out of Food in the Last Year: Patient declined  Transportation Needs: Patient Declined (05/30/2022)   Received from [facility], [facility]   [facility] - Transportation   . In the past 12 months, has lack of transportation kept you from medical appointments or from getting medications?: Patient declined   . Lack of Transportation (Non-Medical): Patient declined  Physical Activity: Not on file  Stress: Not on file  Social Connections: Not on file  Intimate Partner Violence: Not on file     Current Outpatient Medications:  .  venlafaxine XR (EFFEXOR-XR) 75 MG 24 hr capsule, TAKE 1 CAPSULE(75 MG) BY MOUTH EVERY DAY, Disp: , Rfl:      ROS:  Review  of Systems  Constitutional:  Positive for fatigue. Negative for fever and unexpected weight change.  Respiratory:  Negative for cough, shortness of breath and wheezing.   Cardiovascular:  Negative for chest pain, palpitations and leg swelling.  Gastrointestinal:  Negative for blood in stool, constipation, diarrhea, nausea and vomiting.  Endocrine: Negative for cold intolerance, heat intolerance and polyuria.  Genitourinary:  Negative for dyspareunia, dysuria, flank pain, frequency, genital sores, hematuria, menstrual problem, pelvic pain, urgency, vaginal bleeding, vaginal discharge and vaginal pain.  Musculoskeletal:  Negative for back pain, joint swelling and myalgias.  Skin:  Negative for rash.  Neurological:  Negative for dizziness, syncope, light-headedness, numbness and headaches.  Hematological:  Negative for adenopathy.  Psychiatric/Behavioral:  Positive for agitation. Negative for confusion, sleep disturbance and suicidal ideas. The patient is not nervous/anxious.    BREAST: No symptoms   Objective: BP 119/74   Pulse 71   Ht 5\' 4"  (1.626 m)   Wt 148 lb (67.1 kg)   LMP 04/16/2023 (Exact Date)   BMI 25.40 kg/m    Physical Exam Constitutional:      Appearance: She is well-developed.  Genitourinary:     Vulva normal.     Right Labia: No rash, tenderness or lesions.    Left Labia: No tenderness, lesions or rash.    No vaginal discharge, erythema or tenderness.      Right Adnexa: not tender and no mass present.    Left Adnexa: not tender and no mass present.    No cervical friability or polyp.     Uterus is not enlarged or tender.  Rectum:     External hemorrhoid present.  Breasts:    Right: No mass, nipple discharge, skin change or tenderness.     Left: No mass, nipple discharge, skin change or tenderness.  Neck:     Thyroid: No thyromegaly.  Cardiovascular:     Rate and Rhythm: Normal rate and regular rhythm.     Heart sounds: Normal heart sounds. No murmur  heard. Pulmonary:     Effort: Pulmonary effort is normal.     Breath sounds: Normal breath sounds.  Abdominal:     Palpations: Abdomen is soft.     Tenderness: There is no abdominal tenderness. There is no guarding or rebound.  Musculoskeletal:        General: Normal range of motion.     Cervical back: Normal range of motion.  Lymphadenopathy:     Cervical: No cervical adenopathy.  Neurological:     General: No focal deficit present.     Mental Status: She is alert and oriented to person, place, and time.     Cranial Nerves: No cranial nerve deficit.  Skin:    General: Skin is warm and dry.  Psychiatric:        Mood and Affect: Mood normal.        Behavior: Behavior normal.        Thought Content: Thought content normal.        Judgment: Judgment normal.  Vitals reviewed.    Assessment/Plan: Encounter for annual routine gynecological examination  Cervical cancer screening - Plan: Cytology - PAP  Screening for HPV (human papillomavirus) - Plan: Cytology - PAP  Family history of breast cancer--MyRisk testing discussed and handout given. F/u prn.   Other fatigue--increase sleep, add Vit D, see if sx improve.   External hemorrhoid--keep stools soft to prevent flare; OTC products prn.   SUI--add kegels/pelvic floor exercises. F/u prn.            GYN counsel adequate intake of calcium and vitamin D, diet and exercise     F/U  No follow-ups on file.  Lizvet Chunn B. Katheryne Gorr, PA-C 04/21/2023 1:42 PM

## 2023-04-22 ENCOUNTER — Encounter: Payer: Self-pay | Admitting: Obstetrics and Gynecology

## 2023-05-23 ENCOUNTER — Ambulatory Visit: Payer: BC Managed Care – PPO | Admitting: Obstetrics and Gynecology

## 2023-07-14 ENCOUNTER — Other Ambulatory Visit: Payer: Self-pay | Admitting: Obstetrics and Gynecology

## 2023-07-14 DIAGNOSIS — Z30011 Encounter for initial prescription of contraceptive pills: Secondary | ICD-10-CM

## 2023-07-14 DIAGNOSIS — N943 Premenstrual tension syndrome: Secondary | ICD-10-CM

## 2023-07-14 DIAGNOSIS — N946 Dysmenorrhea, unspecified: Secondary | ICD-10-CM

## 2023-07-14 MED ORDER — DROSPIRENONE-ETHINYL ESTRADIOL 3-0.02 MG PO TABS: 1.0000 | ORAL_TABLET | Freq: Every day | ORAL | 0 refills | Status: DC

## 2023-08-05 ENCOUNTER — Encounter: Payer: Self-pay | Admitting: Obstetrics and Gynecology

## 2023-08-05 DIAGNOSIS — N946 Dysmenorrhea, unspecified: Secondary | ICD-10-CM

## 2023-08-05 DIAGNOSIS — R102 Pelvic and perineal pain unspecified side: Secondary | ICD-10-CM

## 2023-08-14 ENCOUNTER — Telehealth: Payer: Self-pay | Admitting: Obstetrics and Gynecology

## 2023-08-14 ENCOUNTER — Other Ambulatory Visit

## 2023-08-14 NOTE — Telephone Encounter (Signed)
 Reached out to pt about gyn [place] that was scheduled on 08/14/2023 at 9:15.  Left message for pt to call back.

## 2023-08-15 ENCOUNTER — Encounter: Payer: Self-pay | Admitting: Obstetrics and Gynecology

## 2023-08-15 NOTE — Telephone Encounter (Signed)
 Reached out to pt (2x) about gyn [place] that was scheduled on 3/13/025 at 9:15.  Left message for pt tocall back.  Will send a MyChart letter to pt.

## 2023-08-19 ENCOUNTER — Ambulatory Visit (HOSPITAL_COMMUNITY)
Admission: RE | Admit: 2023-08-19 | Discharge: 2023-08-19 | Disposition: A | Discharge status: Home / Self Care | Source: Ambulatory Visit | Attending: Obstetrics and Gynecology | Admitting: Obstetrics and Gynecology

## 2023-08-19 DIAGNOSIS — N946 Dysmenorrhea, unspecified: Secondary | ICD-10-CM | POA: Diagnosis not present

## 2023-08-19 DIAGNOSIS — R102 Pelvic and perineal pain: Secondary | ICD-10-CM | POA: Insufficient documentation

## 2023-08-28 ENCOUNTER — Encounter: Payer: Self-pay | Admitting: Obstetrics and Gynecology

## 2023-10-05 ENCOUNTER — Other Ambulatory Visit: Payer: Self-pay | Admitting: Obstetrics and Gynecology

## 2023-10-05 DIAGNOSIS — N943 Premenstrual tension syndrome: Secondary | ICD-10-CM

## 2023-10-05 DIAGNOSIS — N946 Dysmenorrhea, unspecified: Secondary | ICD-10-CM

## 2023-10-05 DIAGNOSIS — Z30011 Encounter for initial prescription of contraceptive pills: Secondary | ICD-10-CM

## 2023-10-06 ENCOUNTER — Other Ambulatory Visit: Payer: Self-pay | Admitting: Obstetrics and Gynecology

## 2023-10-06 ENCOUNTER — Encounter: Payer: Self-pay | Admitting: Obstetrics and Gynecology

## 2023-10-06 DIAGNOSIS — Z30011 Encounter for initial prescription of contraceptive pills: Secondary | ICD-10-CM

## 2023-10-06 DIAGNOSIS — N946 Dysmenorrhea, unspecified: Secondary | ICD-10-CM

## 2023-10-06 DIAGNOSIS — N943 Premenstrual tension syndrome: Secondary | ICD-10-CM

## 2023-10-06 MED ORDER — DROSPIRENONE-ETHINYL ESTRADIOL 3-0.02 MG PO TABS: 1.0000 | ORAL_TABLET | Freq: Every day | ORAL | 1 refills | Status: DC

## 2023-10-06 NOTE — Telephone Encounter (Signed)
Rx RF eRxd.  

## 2023-10-06 NOTE — Progress Notes (Signed)
 Rx RF OCPs till annual due 11/24

## 2023-11-24 DIAGNOSIS — Z1331 Encounter for screening for depression: Secondary | ICD-10-CM | POA: Diagnosis not present

## 2023-11-24 DIAGNOSIS — Z79899 Other long term (current) drug therapy: Secondary | ICD-10-CM | POA: Diagnosis not present

## 2023-11-24 DIAGNOSIS — Z13220 Encounter for screening for lipoid disorders: Secondary | ICD-10-CM | POA: Diagnosis not present

## 2023-11-24 DIAGNOSIS — Z0000 Encounter for general adult medical examination without abnormal findings: Secondary | ICD-10-CM | POA: Diagnosis not present

## 2024-03-10 ENCOUNTER — Other Ambulatory Visit (HOSPITAL_COMMUNITY)
Admission: RE | Admit: 2024-03-10 | Discharge: 2024-03-10 | Disposition: A | Discharge status: Home / Self Care | Source: Ambulatory Visit | Attending: Obstetrics | Admitting: Obstetrics

## 2024-03-10 ENCOUNTER — Ambulatory Visit (INDEPENDENT_AMBULATORY_CARE_PROVIDER_SITE_OTHER): Admitting: Obstetrics

## 2024-03-10 ENCOUNTER — Encounter: Payer: Self-pay | Admitting: Obstetrics

## 2024-03-10 VITALS — BP 123/71 | HR 73 | Ht 64.0 in | Wt 147.0 lb

## 2024-03-10 DIAGNOSIS — N898 Other specified noninflammatory disorders of vagina: Secondary | ICD-10-CM | POA: Diagnosis not present

## 2024-03-10 NOTE — Progress Notes (Signed)
   GYN ENCOUNTER  Subjective  HPI: Shannon Giles is a 38 y.o. [id number] who presents today for vulvar irritation. She reports that she had a boil on her left labia that drained after applying warm compresses. Then she wore leggings and exercised, and afterwards had swelling, pain, and irritation in her left labia. She also noted increased vaginal discharge. She is sexually active with one female partner.   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  No lesions. No edema or erythema  Vagina: No lesions or abnormalities noted. Small amount of whitish/gray discharge  Urethra No masses tenderness or scarring.  Meatus Normal size without lesions or prolapse.  Perineum: Normal exam.  No lesions.    Assessment 1) Vaginal discharge and irritation  Plan 1) NuSwab collected. Encouraged loose fitting clothes, shower right after workout. F/u based on results.   Keonte Daubenspeck, CNM

## 2024-03-12 LAB — CERVICOVAGINAL ANCILLARY ONLY
Bacterial Vaginitis (gardnerella): POSITIVE — AB
Candida Glabrata: NEGATIVE
Candida Vaginitis: NEGATIVE
Comment: NEGATIVE
Comment: NEGATIVE
Comment: NEGATIVE

## 2024-03-14 ENCOUNTER — Encounter: Payer: Self-pay | Admitting: Obstetrics

## 2024-03-14 ENCOUNTER — Other Ambulatory Visit: Payer: Self-pay | Admitting: Obstetrics

## 2024-03-14 DIAGNOSIS — B9689 Other specified bacterial agents as the cause of diseases classified elsewhere: Secondary | ICD-10-CM | POA: Insufficient documentation

## 2024-03-14 MED ORDER — METRONIDAZOLE 500 MG PO TABS: 500.0000 mg | ORAL_TABLET | Freq: Two times a day (BID) | ORAL | 0 refills | Status: AC

## 2024-03-14 NOTE — Progress Notes (Signed)
+  BV. Rx for metronidazole 500 mg PO BID x 7 days sent to pharmacy. Ronica notified via MyChart.  M. Justino, CNM

## 2024-03-21 ENCOUNTER — Other Ambulatory Visit: Payer: Self-pay | Admitting: Obstetrics and Gynecology

## 2024-03-21 DIAGNOSIS — N943 Premenstrual tension syndrome: Secondary | ICD-10-CM

## 2024-03-21 DIAGNOSIS — Z30011 Encounter for initial prescription of contraceptive pills: Secondary | ICD-10-CM

## 2024-03-21 DIAGNOSIS — N946 Dysmenorrhea, unspecified: Secondary | ICD-10-CM

## 2024-03-22 ENCOUNTER — Other Ambulatory Visit: Payer: Self-pay

## 2024-03-22 DIAGNOSIS — N943 Premenstrual tension syndrome: Secondary | ICD-10-CM

## 2024-03-22 DIAGNOSIS — Z30011 Encounter for initial prescription of contraceptive pills: Secondary | ICD-10-CM

## 2024-03-22 DIAGNOSIS — N946 Dysmenorrhea, unspecified: Secondary | ICD-10-CM

## 2024-03-22 MED ORDER — DROSPIRENONE-ETHINYL ESTRADIOL 3-0.02 MG PO TABS: 1.0000 | ORAL_TABLET | Freq: Every day | ORAL | 0 refills | Status: AC

## 2024-04-22 ENCOUNTER — Ambulatory Visit: Admitting: Obstetrics and Gynecology
